# Patient Record
Sex: Female | Born: 1985 | Race: White | Hispanic: No | Marital: Single | Smoking: Former smoker
Health system: Southern US, Community
[De-identification: ages and names within clinical notes are randomized; demographics above are authoritative.]

## PROBLEM LIST (undated history)

## (undated) DIAGNOSIS — J45909 Unspecified asthma, uncomplicated: Secondary | ICD-10-CM

## (undated) DIAGNOSIS — F909 Attention-deficit hyperactivity disorder, unspecified type: Secondary | ICD-10-CM

## (undated) DIAGNOSIS — F988 Other specified behavioral and emotional disorders with onset usually occurring in childhood and adolescence: Secondary | ICD-10-CM

## (undated) DIAGNOSIS — N83209 Unspecified ovarian cyst, unspecified side: Secondary | ICD-10-CM

## (undated) HISTORY — PX: APPENDECTOMY: SHX54

## (undated) HISTORY — DX: Unspecified asthma, uncomplicated: J45.909

## (undated) HISTORY — DX: Other specified behavioral and emotional disorders with onset usually occurring in childhood and adolescence: F98.8

## (undated) HISTORY — DX: Attention-deficit hyperactivity disorder, unspecified type: F90.9

## (undated) HISTORY — PX: BREAST REDUCTION SURGERY: SHX8

---
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
## 2005-02-20 ENCOUNTER — Other Ambulatory Visit: Admission: RE | Admit: 2005-02-20 | Discharge: 2005-02-20 | Payer: Self-pay | Admitting: Internal Medicine

## 2007-05-30 ENCOUNTER — Other Ambulatory Visit: Admission: RE | Admit: 2007-05-30 | Discharge: 2007-05-30 | Payer: Self-pay | Admitting: Internal Medicine

## 2008-04-22 ENCOUNTER — Observation Stay (HOSPITAL_COMMUNITY): Admission: EM | Admit: 2008-04-22 | Discharge: 2008-04-24 | Payer: Self-pay | Admitting: Emergency Medicine

## 2008-04-22 ENCOUNTER — Encounter (INDEPENDENT_AMBULATORY_CARE_PROVIDER_SITE_OTHER): Payer: Self-pay | Admitting: Surgery

## 2008-12-17 ENCOUNTER — Emergency Department (HOSPITAL_COMMUNITY): Admission: EM | Admit: 2008-12-17 | Discharge: 2008-12-17 | Payer: Self-pay | Admitting: Emergency Medicine

## 2010-09-26 NOTE — Op Note (Signed)
NAME:  Pamela Reilly, Pamela Reilly                 ACCOUNT NO.:  [id number]   MEDICAL RECORD NO.:  [id number]          PATIENT TYPE:  INP   LOCATION:  5157                         FACILITY:  [facility]   PHYSICIAN:  Ardeth Sportsman, MD     DATE OF BIRTH:  11-06-1985   DATE OF PROCEDURE:  DATE OF DISCHARGE:                               OPERATIVE REPORT   PRIMARY CARE PHYSICIAN:  Lovenia Kim, DO   EMERGENCY ROOM PHYSICIAN:  Lear Ng, MD at [facility] Emergency  Department.   SURGEON:  Karie Soda, MD   ASSISTANT:  None.   PREOPERATIVE DIAGNOSIS:  Abdominal pain and possible early appendicitis.   POSTOPERATIVE DIAGNOSES:  1. Abdominal pain and possible early appendicitis.  2. Small right ovarian cyst, probably physiologic.   PROCEDURE PERFORMED:  1. Diagnostic laparoscopy.  2. Laparoscopic appendectomy.  3. Laparoscopic aspiration of right ovarian cyst.   ANESTHESIA:  1. General anesthesia.  2. Local anesthetic and a field block on all port sites.   SPECIMEN:  Appendix.   DRAINS:  None.   ESTIMATED BLOOD LOSS:  Less than 5 mL.   COMPLICATIONS:  None apparent.   INDICATIONS:  Pamela Reilly is a 25 year old female with a 48-hour history of  abdominal pain that was periumbilical that became more intense yesterday  and focal on the right lower quadrant with some peritoneal signs.  She  had a normal white count and her CT scan cannot definitely see an  appendix, but there was no definite other etiology except for a small  physiologic right cyst that does not seem consistent and are correlating  with a history being on oral contraceptive pills.  Given the very  intense nature, accelerating worsening pain with her physical exam,  today recommendation was made for diagnostic laparoscopy and at the very  least close monitoring.   Techniques of diagnostic laparoscopy with appendectomy was discussed.  Differential diagnosis was discussed.  Risks, benefits, and alternatives  were discussed.   The patient and family wished to go ahead and proceed  with surgery tonight.   OPERATIVE FINDINGS:  She did not have any strong evidence of any  peritonitis or suppuration.  She had a small retrocecal appendix with  the distal half somewhat obliterated and fibrous and slightly thickened,  concerning for possible early appendicitis.  The small bowel was  completely normal, as well as the colon.  She had a small right ovarian  cyst that looked like a simple physiologic cyst and we aspirated with  clear serous fluid.  Left ovary appeared uninvolved and the uterus  appeared normal as well.  There was no other abnormalities in the  abdominal cavity and there was no evidence of any umbilical or  incisional hernias.   DESCRIPTION OF PROCEDURE:  Informed consent was confirmed.  The patient  received 2 g IV cefoxitin at the beginning of surgery, starting before  incision.  She had sequential compression devices active during the  entire case.  She underwent general anesthesia without any difficulty.  She was placed in supine with both arms tucked.  She had Foley catheter  sterilely placed.  Abdomen was prepped and draped in sterile fashion.   A 5-mm port was placed through the inferior part of the umbilicus using  a mini Hasson technique.  Capnoperitoneum to 15 mmHg provided a good  intraabdominal insufflation.  Camera inspection revealed no  intraabdominal injury.  Under direct visualization, the 5-mm port was  placed in the left lower quadrant and a 12-mm port was placed  suprapubically superior to the bladder.   Diagnostic laparoscopy was performed and revealed the findings as above  with no strong evidence of any severe peritonitis and a small appendix  but it did have some adhesions to the retroperitoneum and seemed to have  a little bit of distal fibrous distal thickening.  Inspection was made  and small bowel was run from the ileocecal valve all the way to ligament  of Treitz.  There  was no Meckel diverticulum or other apparent  abnormality.  There was no massive mesenteric lymphadenopathy to my  examination.  She had very thin mesentery and thin intraabdominal fat.  Colon was run from the cecum all the way down to the peritoneal  reflection and it was normal.  The ovaries appeared to be normal except  for the right ovary did have a cyst and was about 3 cm in size.  It  looked physiologic.  The liver, gallbladder, omentum, peritoneum, and  stomach appeared normal.   I did an appendectomy.  The terminal ileum and cecum were mobilized in  the lateral-to-medial fashion by elevating the cecum and taking off the  avascular side wall at attachments.  This helped mobilize the colon  well.  A window was made at the base of the appendix and the appendix  was transected using a laparoscopic stapler, taking a cuff of normal  cecum.  The appendix was elevated and the mesoappendix was ligated using  0 PDS Endoloop.  The cautery scissors were used to transect through the  mesoappendix with good result.  Appendix was removed out with 12 mm  port.   We went ahead and aspirated the right cyst using a laparoscopic needle  and aspirated about 8 mL of clear serous fluid.  I did not send it for  contents.  She did have few serous/mucoid deposits in the pelvis  consistent with physiological fluid.   Irrigation was done, over 2 liters of fluid with very clear return.  Inspection was made again in all 4 quadrants.  There was no abnormality  or injury.  Capnoperitoneum was evacuated and ports were removed.  The  suprapubic fascial site was closed using a 0-Vicryl stitch.  Skin was  closed using 4-0 Monocryl stitch.  Sterile dressing was applied.   The patient was extubated and was taken to recovery room in stable  condition.  She looks comfortable, relaxed, sleeping.  I am about to  explain the operative findings with the patient's family.  We will at  least observe her  overnight.      Ardeth Sportsman, MD  Electronically Signed     SCG/MEDQ  D:  04/22/2008  T:  04/23/2008  Job:  [id number]   cc:   Lovenia Kim, D.O.

## 2010-09-26 NOTE — H&P (Signed)
NAME:  Pamela Reilly, Pamela Reilly                 ACCOUNT NO.:  [id number]   MEDICAL RECORD NO.:  [id number]          PATIENT TYPE:  INP   LOCATION:  5157                         FACILITY:  [facility]   PHYSICIAN:  Ardeth Sportsman, MD     DATE OF BIRTH:  1985/08/15   DATE OF ADMISSION:  04/22/2008  DATE OF DISCHARGE:                              HISTORY & PHYSICAL   PRIMARY CARE PHYSICIAN:  Lovenia Kim, D.O.   REASON FOR CONSULT ON ADMISSION:  Abdominal pain, possible appendicitis.   HISTORY OF PRESENT ILLNESS:  Ms. Hallmon is a 25 year old female otherwise  very healthy who has got a 48 hour history of abdominal pain.  She  describes it as being periumbilical.  She had some decreased appetite  with this and some nausea as well.  The pain intensified last night.  The pain started being more focal in the right lower quadrant.  She  actually vomited some liquid back up.  No hematemesis.  She denies any  sick contacts or travel history.  She never had anything before.   She is on oral contraceptive pills and her last menstrual period was  last week.  She does get some cramping that is moderate in intensity,  but lasts for a few days and this abdominal pain is nothing like that.  She denies any vaginal bleeding or discharge.  No history of STDs or  other abnormalities.   She has a brother with colon polyps and bleeding and has had endoscopies  in the past.  She had 1 episode of blood in the toilet after having a  bowel movement about a month ago, but no diarrhea associated with any  other abnormalities.  She never had anything before and she had no  abdominal pain at that time.  No history of inflammatory bowel disease  or irritable bowel syndrome.  She only has a bowel movement about 1-2  times a day.   PAST MEDICAL HISTORY:  Recurrent tonsillitis.   PAST SURGICAL HISTORY:  Negative.   MEDICATIONS:  She is on Yaz 28 oral contraceptive pills.   ALLERGIES:  None.   SOCIAL HISTORY:  No tobacco,  alcohol, or drug use.  She is here with her  family including her parents.   FAMILY HISTORY:  A brother with colon polyp, no other history of any  digestive tract etiologies or any cardiopulmonary disease.   REVIEW OF SYSTEMS:  As noted in HPI.  GENERAL:  No gain or weight loss.  She had a temperature of 99.4.  No definite chills or sweats.  Eyes,  ENT, cardiac, respiratory are negative.  Neurologic, psychiatric heme,  lymph, allergic otherwise negative.  Hepatic, renal, and endocrine  negative.  Dermatologic:  Negative.  GI:  As noted above, otherwise  negative.   PHYSICAL EXAMINATION:  VITAL SIGNS:  Temperature 97.3, pulse 107 initial  to my exam with 95, respirations 16, blood pressure 155/94.  She had  10/10 pain after getting 2 doses of IV narcotics in the past hour.  It  has gone down to around 7/10 pain.  She has 100% sats room air.  GENERAL:  She is a well-developed, well-nourished, thin female lying in  bed, anxious, but consolable.  PSYCH:  She had no evidence of any dementia, delirium, psychosis,  paranoia.  She is little anxious, tearful, but consolable.  EYES:  Pupils equal, round and reactive to light.  Extraocular movements  are intact.  Sclerae slightly injected, crying, but no evidence of any  icterus or conjunctivitis.  NECK:  Supple, no masses.  Trachea is midline.  HEENT:  She is normocephalic.  Mucous membranes are moist.  Nasopharynx  and oropharynx are clear.  She has some mildly inflamed tonsils, but no  frank pus or purulence.  HEART:  Regular rate and rhythm.  No murmurs, gallops, or rubs.  CHEST:  Clear to auscultation bilaterally.  No wheezes or rhonchi.  No  pain on external compression.  BREAST:  No obvious masses or discharge.  ABDOMEN:  Soft and flat.  She is nontender on her left side in the right  upper quadrant but in the right lower quadrant she does have some  abdominal pain.  I would not say she jumps off McBurney point, but she  is obviously  uncomfortable.  She had some urinary urgency when I pressed  in the suprapubic region.  She does have a positive psoas sign, but not  certainly an obturator sign.  She does have pain to cough and bed shake,  and she has Rovsing sign with discomfort when I press the left lower  quadrant.  GENITALIA:  External female genitalia with inguinal hernias.  LYMPH:  No headache, axillary or groin lymphadenopathy.  RECTAL:  Refused on the patient's request.  EXTREMITIES:  No clubbing, cyanosis or edema.  MUSCULOSKELETAL:  Full range of motion in shoulders, elbows, wrist as  well as hip, knees, and ankles.  SKIN:  No petechiae, no purpura, no other source of lesion.  BACK:  No pain in cervical, thoracic, lumbosacral spine.   LABORATORY VALUES:  Her white count is normal at 7.9 with no left shift.  Urinalysis negative.  Urine pregnancy is negative electrolytes are okay.  She had wet prep that showed a few clue cells but otherwise completely  negative prep.  Please note above that Dr. Alonna Minium did a complete pelvic  exam and it was overwhelming to him.   STUDIES:  CT scan was done in the outside where the appendix cannot be  seen and her right ovarian cyst.  I looked to the films with radiology  as well.  I cannot definitely see the appendix.  There is no definite  stranding in the right lower quadrant with just a little bit of free  fluid that sits between her cecum and her bladder but it seems more  physiologic.  She has an obvious cyst in the right ovary around 2 cm at  best and seems to be physiologic and not particularly inflamed.  Left  ovary seems normal.  Uterus is slightly to the right.  There is no  evidence of bowel obstruction.  There is no terminal ileal thickening.  There is no evidence of any diverticulosis or free air.  She has no  obvious umbilical or inguinal hernias.  On CT scan, she does have some  mildly enlarged lymph nodes in her small bowel mesentery concerning for  mesenteric  adenitis.   ASSESSMENT AND PLAN:  A 25 year old female with classic story for acute  appendicitis without any definite laboratory and some physical  examination  findings concerning for some mild peritonitis and right  lower quadrant pain but with no elevated white count and CT that is not  diagnostic.   Differential diagnosis is discussed.   Abdominal pain of uncertain etiology.   Deferential diagnosis is discussed.  I think it mainly narrows down to  either mesenteric adenitis or an early appendicitis that we cannot  diagnose radiographically or she just has an uncomfortable right ovarian  cyst.  The cyst is not particularly enlarged seems physiologic and she  is already on oral contraceptive pills.  I am a little skeptical about  the definite etiology.  She has been on the oral contraceptive pills for  a while, and she notes that when she has been on that her menstrual  cycle pain is markedly gone down.   Because she is exquisitely tender and the pain is intensified and  accelerated I do not think it is safe for her to go home.  I offered to  watch her overnight and if she is not improved then to operate her  tomorrow, but my instinct feels that I should probably do diagnostic  laparoscopic now to make sure we are not missing anything that cannot be  seen on CAT scan.  I discussed with the patient and family.  They and  she would rather go and proceed with the surgery now than to see how she  is since she realizes the pain has been more intense.  She understands  it could be a negative diagnostic laparoscopy, but I think given her  discomfort it would be wise to check to be certain.   Anatomy and physiology of the digestive tract was explained.  Deferential diagnosis is discussed.  Technique of diagnostic laparoscopy  with appendectomy was discussed.  She and her family agreed to proceed.  We would do it pretty soon.      Ardeth Sportsman, MD  Electronically Signed      SCG/MEDQ  D:  04/22/2008  T:  04/23/2008  Job:  [id number]

## 2011-02-16 LAB — POCT I-STAT, CHEM 8
BUN: 5 mg/dL — ABNORMAL LOW (ref 6–23)
Calcium, Ion: 1.25 mmol/L (ref 1.12–1.32)
Chloride: 102 mEq/L (ref 96–112)
Sodium: 138 mEq/L (ref 135–145)

## 2011-02-16 LAB — CBC
HCT: 43.4 % (ref 36.0–46.0)
Hemoglobin: 14.8 g/dL (ref 12.0–15.0)
MCHC: 34.2 g/dL (ref 30.0–36.0)
MCV: 88.4 fL (ref 78.0–100.0)
Platelets: 228 K/uL (ref 150–400)
RBC: 4.91 MIL/uL (ref 3.87–5.11)
RDW: 12.4 % (ref 11.5–15.5)
WBC: 7.1 10*3/uL (ref 4.0–10.5)

## 2011-02-16 LAB — DIFFERENTIAL
Basophils Absolute: 0 10*3/uL (ref 0.0–0.1)
Basophils Relative: 0 % (ref 0–1)
Eosinophils Absolute: 0.1 K/uL (ref 0.0–0.7)
Eosinophils Relative: 2 % (ref 0–5)
Lymphocytes Relative: 27 % (ref 12–46)
Lymphs Abs: 1.9 K/uL (ref 0.7–4.0)
Monocytes Absolute: 0.5 10*3/uL (ref 0.1–1.0)
Monocytes Relative: 7 % (ref 3–12)
Neutro Abs: 4.5 10*3/uL (ref 1.7–7.7)
Neutrophils Relative %: 64 % (ref 43–77)

## 2011-02-16 LAB — URINALYSIS, ROUTINE W REFLEX MICROSCOPIC
Bilirubin Urine: NEGATIVE
Glucose, UA: NEGATIVE mg/dL
Hgb urine dipstick: NEGATIVE
Ketones, ur: NEGATIVE mg/dL
Nitrite: NEGATIVE
Protein, ur: NEGATIVE mg/dL
Specific Gravity, Urine: >1.046 — ABNORMAL HIGH (ref 1.005–1.030)
Urobilinogen, UA: 0.2 mg/dL (ref 0.0–1.0)
pH: 7.5 (ref 5.0–8.0)

## 2011-02-16 LAB — WET PREP, GENITAL
Trich, Wet Prep: NONE SEEN
WBC, Wet Prep HPF POC: NONE SEEN
Yeast Wet Prep HPF POC: NONE SEEN

## 2011-02-16 LAB — POCT PREGNANCY, URINE: Preg Test, Ur: NEGATIVE

## 2011-02-16 LAB — GC/CHLAMYDIA PROBE AMP, GENITAL
Chlamydia, DNA Probe: NEGATIVE
GC Probe Amp, Genital: NEGATIVE

## 2013-03-31 ENCOUNTER — Encounter: Payer: Self-pay | Admitting: Physician Assistant

## 2013-03-31 ENCOUNTER — Ambulatory Visit: Payer: Self-pay | Admitting: Emergency Medicine

## 2013-03-31 DIAGNOSIS — F988 Other specified behavioral and emotional disorders with onset usually occurring in childhood and adolescence: Secondary | ICD-10-CM | POA: Insufficient documentation

## 2013-04-01 ENCOUNTER — Ambulatory Visit: Payer: Self-pay | Admitting: Physician Assistant

## 2013-04-01 ENCOUNTER — Encounter: Payer: Self-pay | Admitting: Physician Assistant

## 2013-04-01 VITALS — BP 110/72 | HR 60 | Temp 98.1°F | Resp 16 | Ht 66.25 in | Wt 146.0 lb

## 2013-04-01 DIAGNOSIS — F909 Attention-deficit hyperactivity disorder, unspecified type: Secondary | ICD-10-CM | POA: Insufficient documentation

## 2013-04-01 DIAGNOSIS — N63 Unspecified lump in unspecified breast: Secondary | ICD-10-CM

## 2013-04-01 MED ORDER — AMPHETAMINE-DEXTROAMPHETAMINE 20 MG PO TABS: 20.0000 mg | ORAL_TABLET | Freq: Two times a day (BID) | ORAL | Status: DC

## 2013-04-01 NOTE — Patient Instructions (Signed)
Breast Cyst  A breast cyst is a sac in the breast that is filled with fluid. Breast cysts are common in women. Women can have one or many cysts. When the breasts contain many cysts, it is usually due to a noncancerous (benign) condition called fibrocystic change. These lumps form under the influence of female hormones (estrogen and progesterone). The lumps are most often located in the upper, outer portion of the breast. They are often more swollen, painful, and tender before your period starts. They usually disappear after menopause, unless you are on hormone therapy.   There are several types of cysts:  · Macrocyst. This is a cyst that is about 2 in. (5.1 cm) in diameter.    · Microcyst. This is a tiny cyst that you cannot feel but can be seen with a mammogram or an ultrasound.    · Galactocele. This is a cyst containing milk that may develop if you suddenly stop breastfeeding.    · Sebaceous cyst of the skin. This type of cyst is not in the breast tissue itself.  Breast cysts do not increase your risk of breast cancer. However, they must be monitored closely because they can be cancerous.   CAUSES   It is not known exactly what causes a breast cyst to form. Possible causes include:   · An overgrowth of milk glands and connective tissue in the breast can block the milk glands, causing them to fill with fluid.    · Scar tissue in the breast from previous surgery may block the glands, causing a cyst.    RISK FACTORS  Estrogen may influence the development of a breast cyst.    SIGNS AND SYMPTOMS   · Feeling a smooth, round, soft lump (like a grape) in the breast that is easily moveable.    · Breast discomfort or pain.  · Increase in size of the lump before your menstrual period and decrease in its size after your menstrual period.    DIAGNOSIS   A cyst can be felt during a physical exam by your health care provider. A breast X-ray exam (mammogram) and ultrasonography will be done to confirm the diagnosis. Fluid may  be removed from the cyst with a needle (fine needle aspiration) to make sure the cyst is not cancerous.    TREATMENT   Treatment may not be necessary. Your health care provider may monitor the cyst to see if it goes away on its own. If treatment is needed, it may include:  · Hormone treatment.    · Needle aspiration. There is a chance of the cyst coming back after aspiration.    · Surgery to remove the whole cyst.    HOME CARE INSTRUCTIONS   · Keep all follow-up appointments with your health care provider.  · See your health care provider regularly:  · Get a yearly exam by your health care provider.  · Have a clinical breast exam by a health care provider every 1 3 years if you are 20 27 years of age. After age 40 years, you should have the exam every year.    · Get mammogram tests as directed by your health care provider.    · Understand the normal appearance and feel of your breasts and perform breast self-exams.    · Only take over-the-counter or prescription medicines as directed by your health care provider.    · Wear a supportive bra, especially when exercising.    · Avoid caffeine.    · Reduce your salt intake, especially before your menstrual period. Too much salt can cause fluid retention, breast   swelling, and discomfort.    SEEK MEDICAL CARE IF:   · You feel, or think you feel, a lump in your breast.    · You notice that both breasts look or feel different than usual.    · Your breast is still causing pain after your menstrual period is over.    · You need medicine for breast pain and swelling that occurs with your menstrual period.    SEEK IMMEDIATE MEDICAL CARE IF:   · You have severe pain, tenderness, redness, or warmth in your breast.    · You have nipple discharge or bleeding.    · Your breast lump becomes hard and painful.    · You find new lumps or bumps that were not there before.    · You feel lumps in your armpit (axilla).    · You notice dimpling or wrinkling of the breast or nipple.    · You  have a fever.    MAKE SURE YOU:  · Understand these instructions.  · Will watch your condition.  · Will get help right away if you are not doing well or get worse.  Document Released: 04/30/2005 Document Revised: 12/31/2012 Document Reviewed: 11/27/2012  [employer]® Patient Information ©2014 [employer].

## 2013-04-01 NOTE — Progress Notes (Signed)
  Subjective:    Patient ID: Pamela Reilly, female    DOB: May 17, 1985, 27 y.o.   MRN: [id number]  HPI History of breast reduction 4 years ago and last week felt tender, mass on right breast over the inscion. Does have family history of breast cancer in mgrandmother and two m aunts. Has had night sweats but no weight loss. Patient also states she has had a rash/skin change on her right breast.   Past Medical History  Diagnosis Date  . Asthma   . ADD (attention deficit disorder)   . ADHD (attention deficit hyperactivity disorder)    Current Outpatient Prescriptions on File Prior to Visit  Medication Sig Dispense Refill  . amphetamine-dextroamphetamine (ADDERALL) 20 MG tablet Take 20 mg by mouth 2 (two) times daily.       No current facility-administered medications on file prior to visit.    Review of Systems  Constitutional: Positive for fatigue (night sweats). Negative for fever, chills, diaphoresis and appetite change.  HENT: Negative.   Eyes: Negative.   Respiratory: Negative.   Cardiovascular: Negative.   Gastrointestinal: Negative.   Genitourinary: Negative.   Musculoskeletal: Negative.   Skin: Positive for rash.  Neurological: Negative.        Objective:   Physical Exam  Constitutional: She is oriented to person, place, and time. She appears well-developed and well-nourished.  HENT:  Head: Normocephalic and atraumatic.  Eyes: Conjunctivae are normal. Pupils are equal, round, and reactive to light.  Neck: Normal range of motion. Neck supple.  Cardiovascular: Normal rate and regular rhythm.   Pulmonary/Chest: Effort normal and breath sounds normal. Right breast exhibits mass, skin change and tenderness. Right breast exhibits no inverted nipple and no nipple discharge.    Abdominal: Soft. Bowel sounds are normal.  Musculoskeletal: Normal range of motion.  Neurological: She is alert and oriented to person, place, and time.  Skin: Skin is warm and dry. Rash (inferior to  right nipple dry, orange rough skin) noted.      Assessment & Plan:  1. Breast mass Possible FBD, patient given instructions for cyst however with her family history and skin changes we will get a diagnostic [facility] - MM Digital Diagnostic Unilat R; Future  2. ADD Adderall 20mg  #60 NR

## 2013-04-02 ENCOUNTER — Other Ambulatory Visit: Payer: Self-pay | Admitting: Physician Assistant

## 2013-04-02 DIAGNOSIS — N63 Unspecified lump in unspecified breast: Secondary | ICD-10-CM

## 2013-04-02 DIAGNOSIS — R234 Changes in skin texture: Secondary | ICD-10-CM

## 2013-04-07 ENCOUNTER — Ambulatory Visit
Admission: RE | Admit: 2013-04-07 | Discharge: 2013-04-07 | Disposition: A | Discharge status: Home / Self Care | Payer: No Typology Code available for payment source | Source: Ambulatory Visit | Attending: Physician Assistant | Admitting: Physician Assistant

## 2013-04-07 DIAGNOSIS — N63 Unspecified lump in unspecified breast: Secondary | ICD-10-CM

## 2013-04-07 DIAGNOSIS — R234 Changes in skin texture: Secondary | ICD-10-CM

## 2013-04-28 ENCOUNTER — Other Ambulatory Visit: Payer: Self-pay | Admitting: Physician Assistant

## 2013-04-28 MED ORDER — AMPHETAMINE-DEXTROAMPHETAMINE 20 MG PO TABS: 20.0000 mg | ORAL_TABLET | Freq: Two times a day (BID) | ORAL | Status: DC

## 2013-05-14 HISTORY — PX: TONSILLECTOMY: SUR1361

## 2013-06-01 ENCOUNTER — Other Ambulatory Visit: Payer: Self-pay | Admitting: Physician Assistant

## 2013-06-01 MED ORDER — AMPHETAMINE-DEXTROAMPHETAMINE 20 MG PO TABS: 20.0000 mg | ORAL_TABLET | Freq: Two times a day (BID) | ORAL | Status: DC

## 2013-06-29 ENCOUNTER — Other Ambulatory Visit: Payer: Self-pay | Admitting: Emergency Medicine

## 2013-06-29 MED ORDER — AMPHETAMINE-DEXTROAMPHETAMINE 20 MG PO TABS: 20.0000 mg | ORAL_TABLET | Freq: Two times a day (BID) | ORAL | Status: DC

## 2013-07-27 ENCOUNTER — Other Ambulatory Visit: Payer: Self-pay | Admitting: Emergency Medicine

## 2013-07-28 ENCOUNTER — Encounter: Payer: Self-pay | Admitting: Emergency Medicine

## 2013-07-28 ENCOUNTER — Ambulatory Visit (INDEPENDENT_AMBULATORY_CARE_PROVIDER_SITE_OTHER): Payer: No Typology Code available for payment source | Admitting: Emergency Medicine

## 2013-07-28 VITALS — BP 126/80 | HR 88 | Temp 98.6°F | Resp 18 | Ht 65.5 in | Wt 136.0 lb

## 2013-07-28 DIAGNOSIS — J329 Chronic sinusitis, unspecified: Secondary | ICD-10-CM

## 2013-07-28 DIAGNOSIS — J039 Acute tonsillitis, unspecified: Secondary | ICD-10-CM

## 2013-07-28 DIAGNOSIS — J40 Bronchitis, not specified as acute or chronic: Secondary | ICD-10-CM

## 2013-07-28 DIAGNOSIS — F988 Other specified behavioral and emotional disorders with onset usually occurring in childhood and adolescence: Secondary | ICD-10-CM

## 2013-07-28 DIAGNOSIS — J45909 Unspecified asthma, uncomplicated: Secondary | ICD-10-CM

## 2013-07-28 DIAGNOSIS — J309 Allergic rhinitis, unspecified: Secondary | ICD-10-CM

## 2013-07-28 MED ORDER — BENZONATATE 100 MG PO CAPS: 100.0000 mg | ORAL_CAPSULE | Freq: Three times a day (TID) | ORAL | Status: DC | PRN

## 2013-07-28 MED ORDER — AMPHETAMINE-DEXTROAMPHETAMINE 20 MG PO TABS: 20.0000 mg | ORAL_TABLET | Freq: Two times a day (BID) | ORAL | Status: DC

## 2013-07-28 MED ORDER — ALBUTEROL SULFATE HFA 108 (90 BASE) MCG/ACT IN AERS: 2.0000 | INHALATION_SPRAY | Freq: Four times a day (QID) | RESPIRATORY_TRACT | Status: DC | PRN

## 2013-07-28 MED ORDER — AZITHROMYCIN 250 MG PO TABS: ORAL_TABLET | ORAL | Status: AC

## 2013-07-28 MED ORDER — PREDNISONE 10 MG PO TABS: ORAL_TABLET | ORAL | Status: DC

## 2013-07-28 NOTE — Progress Notes (Signed)
Subjective:    Patient ID: Pamela Reilly, female    DOB: 06/16/1985, 28 y.o.   MRN: [id number]  HPI Comments: 28 yo female for ADD recheck she has been using BID. She notes helps with focus she only uses at work and does not take on the weekends unless needed. She denies any adverse SE  She notes chronic tonsils swelling. She notes snoring increased. She gets sick every other month. She has been sick x  4 days with allergy drainage that is now green from nose. Chest is just starting to be productive. She has been taking OTC Tylenol/ cold/ flu w/o relief. She notes she feels worse at night with mild fever.   Cough Associated symptoms include ear pain, a fever and a sore throat.     Medication List       This list is accurate as of: 07/28/13 11:59 PM.  Always use your most recent med list.               albuterol 108 (90 BASE) MCG/ACT inhaler  Commonly known as:  PROVENTIL HFA;VENTOLIN HFA  Inhale 2 puffs into the lungs every 6 (six) hours as needed for wheezing or shortness of breath.     amphetamine-dextroamphetamine 20 MG tablet  Commonly known as:  ADDERALL  Take 1 tablet (20 mg total) by mouth 2 (two) times daily.     azithromycin 250 MG tablet  Commonly known as:  ZITHROMAX  Take 2 tablets (500 mg) on  Day 1,  followed by 1 tablet (250 mg) once daily on Days 2 through 5.     benzonatate 100 MG capsule  Commonly known as:  TESSALON PERLES  Take 1 capsule (100 mg total) by mouth 3 (three) times daily as needed for cough.     predniSONE 10 MG tablet  Commonly known as:  DELTASONE  1 po TID x 3 days, 1 PO BID x 3 days, 1 po QD x 5 days       No Known Allergies  Past Medical History  Diagnosis Date  . Asthma   . ADD (attention deficit disorder)   . ADHD (attention deficit hyperactivity disorder)      Review of Systems  Constitutional: Positive for fever.  HENT: Positive for congestion, ear pain, sinus pressure and sore throat.   Respiratory: Positive for cough.    Psychiatric/Behavioral: Positive for decreased concentration.  All other systems reviewed and are negative.  BP 126/80  Pulse 88  Temp(Src) 98.6 F (37 C) (Temporal)  Resp 18  Ht 5' 5.5" (1.664 m)  Wt 136 lb (61.689 kg)  BMI 22.28 kg/m2  LMP 07/14/2013      Objective:   Physical Exam  Nursing note and vitals reviewed. Constitutional: She is oriented to person, place, and time. She appears well-developed and well-nourished.  HENT:  Head: Normocephalic and atraumatic.  Right Ear: External ear normal.  Left Ear: External ear normal.  Nose: Nose normal.  Mouth/Throat: Oropharynx is clear and moist. No oropharyngeal exudate.  Yellow TMs bilateral Maxillary tenderness   Eyes: Conjunctivae and EOM are normal.  Neck: Normal range of motion.  Cardiovascular: Normal rate, regular rhythm, normal heart sounds and intact distal pulses.   Pulmonary/Chest: Effort normal and breath sounds normal.  Musculoskeletal: Normal range of motion.  Lymphadenopathy:    She has no cervical adenopathy.  Neurological: She is alert and oriented to person, place, and time.  Skin: Skin is warm and dry.  Psychiatric: She has  a normal mood and affect. Judgment normal.          Assessment & Plan:  1. Sinusitis, Bronchitis, tonsillitis, Allergic rhinitis- Allegra OTC, increase H2o, allergy hygiene explained. ZPAK, Pred DP 10 mg Both AD. Tessalon perles 100 mg, Albuterol HFa both AD. Requests ENT referral  2. ADD- Refill RX AD

## 2013-07-28 NOTE — Patient Instructions (Signed)
Tonsillitis Tonsillitis is an infection of the throat that causes the tonsils to become red, tender, and swollen. Tonsils are collections of lymphoid tissue at the back of the throat. Each tonsil has crevices (crypts). Tonsils help fight nose and throat infections and keep infection from spreading to other parts of the body for the first 18 months of life.  CAUSES Sudden (acute) tonsillitis is usually caused by infection with streptococcal bacteria. Long-lasting (chronic) tonsillitis occurs when the crypts of the tonsils become filled with pieces of food and bacteria, which makes it easy for the tonsils to become repeatedly infected. SYMPTOMS  Symptoms of tonsillitis include:  A sore throat, with possible difficulty swallowing.  White patches on the tonsils.  Fever.  Tiredness.  New episodes of snoring during sleep, when you did not snore before.  Small, foul-smelling, yellowish-white pieces of material (tonsilloliths) that you occasionally cough up or spit out. The tonsilloliths can also cause you to have bad breath. DIAGNOSIS Tonsillitis can be diagnosed through a physical exam. Diagnosis can be confirmed with the results of lab tests, including a throat culture. TREATMENT  The goals of tonsillitis treatment include the reduction of the severity and duration of symptoms and prevention of associated conditions. Symptoms of tonsillitis can be improved with the use of steroids to reduce the swelling. Tonsillitis caused by bacteria can be treated with antibiotics. Usually, treatment with antibiotics is started before the cause of the tonsillitis is known. However, if it is determined that the cause is not bacterial, antibiotics will not treat the tonsillitis. If attacks of tonsillitis are severe and frequent, your caregiver may recommend surgery to remove the tonsils (tonsillectomy). HOME CARE INSTRUCTIONS   Rest as much as possible and get plenty of sleep.  Drink plenty of fluids. While the  throat is very sore, eat soft foods or liquids, such as sherbet, soups, or instant breakfast drinks.  Eat frozen ice pops.  Gargle with a warm or cold liquid to help soothe the throat. Mix 1/4 teaspoon of salt and 1/4 teaspoon of baking soda in in 8 oz of water. SEEK MEDICAL CARE IF:   Large, tender lumps develop in your neck.  A rash develops.  A green, yellow-brown, or bloody substance is coughed up.  You are unable to swallow liquids or food for 24 hours.  You notice that only one of the tonsils is swollen. SEEK IMMEDIATE MEDICAL CARE IF:   You develop any new symptoms such as vomiting, severe headache, stiff neck, chest pain, or trouble breathing or swallowing.  You have severe throat pain along with drooling or voice changes.  You have severe pain, unrelieved with recommended medications.  You are unable to fully open the mouth.  You develop redness, swelling, or severe pain anywhere in the neck.  You have a fever. MAKE SURE YOU:   Understand these instructions.  Will watch your condition.  Will get help right away if you are not doing well or get worse. Document Released: 02/07/2005 Document Revised: 12/31/2012 Document Reviewed: 10/17/2012 [facility] Patient Information 2014 [place], [place]. Allergic Rhinitis Allergic rhinitis is when the mucous membranes in the nose respond to allergens. Allergens are particles in the air that cause your body to have an allergic reaction. This causes you to release allergic antibodies. Through a chain of events, these eventually cause you to release histamine into the blood stream. Although meant to protect the body, it is this release of histamine that causes your discomfort, such as frequent sneezing, congestion, and an  itchy, runny nose.  CAUSES  Seasonal allergic rhinitis (hay fever) is caused by pollen allergens that may come from grasses, trees, and weeds. Year-round allergic rhinitis (perennial allergic rhinitis) is caused by  allergens such as house dust mites, pet dander, and mold spores.  SYMPTOMS   Nasal stuffiness (congestion).  Itchy, runny nose with sneezing and tearing of the eyes. DIAGNOSIS  Your health care provider can help you determine the allergen or allergens that trigger your symptoms. If you and your health care provider are unable to determine the allergen, skin or blood testing may be used. TREATMENT  Allergic Rhinitis does not have a cure, but it can be controlled by:  Medicines and allergy shots (immunotherapy).  Avoiding the allergen. Hay fever may often be treated with antihistamines in pill or nasal spray forms. Antihistamines block the effects of histamine. There are over-the-counter medicines that may help with nasal congestion and swelling around the eyes. Check with your health care provider before taking or giving this medicine.  If avoiding the allergen or the medicine prescribed do not work, there are many new medicines your health care provider can prescribe. Stronger medicine may be used if initial measures are ineffective. Desensitizing injections can be used if medicine and avoidance does not work. Desensitization is when a patient is given ongoing shots until the body becomes less sensitive to the allergen. Make sure you follow up with your health care provider if problems continue. HOME CARE INSTRUCTIONS It is not possible to completely avoid allergens, but you can reduce your symptoms by taking steps to limit your exposure to them. It helps to know exactly what you are allergic to so that you can avoid your specific triggers. SEEK MEDICAL CARE IF:   You have a fever.  You develop a cough that does not stop easily (persistent).  You have shortness of breath.  You start wheezing.  Symptoms interfere with normal daily activities. Document Released: 01/23/2001 Document Revised: 02/18/2013 Document Reviewed: 01/05/2013 [facility] Patient Information 2014 [place].

## 2013-07-29 ENCOUNTER — Encounter: Payer: Self-pay | Admitting: Emergency Medicine

## 2013-07-29 DIAGNOSIS — J45909 Unspecified asthma, uncomplicated: Secondary | ICD-10-CM

## 2013-07-29 HISTORY — DX: Unspecified asthma, uncomplicated: J45.909

## 2013-08-25 ENCOUNTER — Other Ambulatory Visit: Payer: Self-pay | Admitting: Physician Assistant

## 2013-08-25 MED ORDER — AMPHETAMINE-DEXTROAMPHETAMINE 20 MG PO TABS
20.0000 mg | ORAL_TABLET | Freq: Two times a day (BID) | ORAL | Status: DC
Start: 2013-08-25 — End: 2013-09-16

## 2013-09-16 ENCOUNTER — Other Ambulatory Visit: Payer: Self-pay | Admitting: Internal Medicine

## 2013-09-16 DIAGNOSIS — F988 Other specified behavioral and emotional disorders with onset usually occurring in childhood and adolescence: Secondary | ICD-10-CM

## 2013-09-16 MED ORDER — AMPHETAMINE-DEXTROAMPHETAMINE 20 MG PO TABS: 20.0000 mg | ORAL_TABLET | Freq: Two times a day (BID) | ORAL | Status: DC

## 2013-10-19 ENCOUNTER — Other Ambulatory Visit: Payer: Self-pay | Admitting: Emergency Medicine

## 2013-10-19 DIAGNOSIS — F988 Other specified behavioral and emotional disorders with onset usually occurring in childhood and adolescence: Secondary | ICD-10-CM

## 2013-10-19 MED ORDER — AMPHETAMINE-DEXTROAMPHETAMINE 20 MG PO TABS: 20.0000 mg | ORAL_TABLET | Freq: Two times a day (BID) | ORAL | Status: DC

## 2013-11-16 ENCOUNTER — Other Ambulatory Visit: Payer: Self-pay | Admitting: Physician Assistant

## 2013-11-16 DIAGNOSIS — F988 Other specified behavioral and emotional disorders with onset usually occurring in childhood and adolescence: Secondary | ICD-10-CM

## 2013-11-16 MED ORDER — AMPHETAMINE-DEXTROAMPHETAMINE 20 MG PO TABS: 20.0000 mg | ORAL_TABLET | Freq: Two times a day (BID) | ORAL | Status: DC

## 2013-12-16 ENCOUNTER — Other Ambulatory Visit: Payer: Self-pay | Admitting: Internal Medicine

## 2013-12-16 DIAGNOSIS — F988 Other specified behavioral and emotional disorders with onset usually occurring in childhood and adolescence: Secondary | ICD-10-CM

## 2013-12-16 MED ORDER — AMPHETAMINE-DEXTROAMPHETAMINE 20 MG PO TABS: 20.0000 mg | ORAL_TABLET | Freq: Two times a day (BID) | ORAL | Status: DC

## 2014-01-05 ENCOUNTER — Ambulatory Visit (INDEPENDENT_AMBULATORY_CARE_PROVIDER_SITE_OTHER): Payer: Self-pay | Admitting: Internal Medicine

## 2014-01-05 ENCOUNTER — Encounter: Payer: Self-pay | Admitting: Internal Medicine

## 2014-01-05 VITALS — BP 134/90 | HR 88 | Temp 98.8°F | Resp 16 | Ht 65.5 in | Wt 148.2 lb

## 2014-01-05 DIAGNOSIS — Z3009 Encounter for other general counseling and advice on contraception: Secondary | ICD-10-CM

## 2014-01-05 DIAGNOSIS — F988 Other specified behavioral and emotional disorders with onset usually occurring in childhood and adolescence: Secondary | ICD-10-CM

## 2014-01-05 DIAGNOSIS — Z30011 Encounter for initial prescription of contraceptive pills: Secondary | ICD-10-CM

## 2014-01-05 DIAGNOSIS — J209 Acute bronchitis, unspecified: Secondary | ICD-10-CM

## 2014-01-05 MED ORDER — CEPHALEXIN 500 MG PO CAPS: 500.0000 mg | ORAL_CAPSULE | Freq: Four times a day (QID) | ORAL | Status: AC

## 2014-01-05 MED ORDER — PREDNISONE 20 MG PO TABS
ORAL_TABLET | ORAL | Status: DC
Start: 2014-01-05 — End: 2014-09-20

## 2014-01-05 MED ORDER — NORGESTREL-ETHINYL ESTRADIOL 0.3-30 MG-MCG PO TABS: 1.0000 | ORAL_TABLET | Freq: Every day | ORAL | Status: DC

## 2014-01-05 MED ORDER — AMPHETAMINE-DEXTROAMPHETAMINE 20 MG PO TABS: ORAL_TABLET | ORAL | Status: DC

## 2014-01-05 NOTE — Progress Notes (Signed)
   Subjective:    Patient ID: Pamela Reilly, female    DOB: [birth year], 28 y.o.   MRN: [id number]  HPI Patient presents with a 3-4 week Hx/o cough - now productive of a greenish sputum. Also requests refill of Adderall which apparently she take very infrequently with improved ability to focus and stay on task. Lastly she desires a BCP and repots that she is not currently pregnant.    Medication List   albuterol MDI HFA inhaler  Inhale 2 puffs into the lungs every 6 (six) hours as needed     amphetamine-dextroamphetamine 20 MG tablet   ADDERALL  Take 1/2 to 1 tablet 2 to 3 x day if needed for ADD     cephALEXin 500 MG capsule  Take 1 capsule (500 mg total) by mouth 4 (four) times daily. For infection     norgestrel-ethinyl estradiol 0.3-30 MG-MCG tablet  Commonly known as:  LO/OVRAL  Take 1 tablet by mouth daily.     predniSONE 20 MG tablet  1 tab 3 x day for 3 days, then 1 tab 2 x day for 3 days, then 1 tab 1 x day for 5 days     No Known Allergies  Past Medical History  Diagnosis Date  . Asthma   . ADD (attention deficit disorder)   . ADHD (attention deficit hyperactivity disorder)   . Asthma due to seasonal allergies 07/29/2013   Review of Systems Neg except as above  Objective:   Physical Exam  BP 134/90  Pulse 88  Temp(Src) 98.8 F (37.1 C) (Temporal)  Resp 16  Ht 5' 5.5" (1.664 m)  Wt 148 lb 3.2 oz (67.223 kg)  BMI 24.28 kg/m2  HEENT - Eac's patent. TM's Nl. EOM's full. PERRLA. NasoOroPharynx clear. Neck - supple. Nl Thyroid. Carotids 2+ & No bruits, nodes, JVD Chest - Clear equal BS w/scattered Rales and rhonchi,but no wheezes. Cor - Nl HS. RRR w/o sig MGR. PP 1(+). No edema. Abd - No palpable organomegaly, masses or tenderness. BS nl. MS- FROM w/o deformities. Muscle power, tone and bulk Nl. Gait Nl. Neuro - No obvious Cr N abnormalities. Sensory, motor and Cerebellar functions appear Nl w/o focal abnormalities.  Assessment & Plan:   1. ADD (attention  deficit disorder) -Rx amphetamine-dextroamphetamine (ADDERALL) 20 MG tablet; Take 1/2 to 1 tablet 2 to 3 x day if needed for ADD  Dispense: 90 tablet; Refill: 0 Advised tolimit max 2/day  2. Acute bronchitis, unspecified organism -Rx Keflex 500 mg # 42 - qid -Rx Prednisone taper  3. Encounter for initial prescription of contraceptive pills -Rx LoOvral & advised also take bASA  for clot prophylaxis

## 2014-01-05 NOTE — Patient Instructions (Signed)

## 2014-02-16 ENCOUNTER — Other Ambulatory Visit: Payer: Self-pay | Admitting: Physician Assistant

## 2014-02-16 DIAGNOSIS — F988 Other specified behavioral and emotional disorders with onset usually occurring in childhood and adolescence: Secondary | ICD-10-CM

## 2014-02-16 MED ORDER — AMPHETAMINE-DEXTROAMPHETAMINE 20 MG PO TABS: ORAL_TABLET | ORAL | Status: DC

## 2014-03-19 ENCOUNTER — Other Ambulatory Visit: Payer: Self-pay | Admitting: Physician Assistant

## 2014-03-19 DIAGNOSIS — F988 Other specified behavioral and emotional disorders with onset usually occurring in childhood and adolescence: Secondary | ICD-10-CM

## 2014-03-19 MED ORDER — AMPHETAMINE-DEXTROAMPHETAMINE 20 MG PO TABS: ORAL_TABLET | ORAL | Status: DC

## 2014-05-04 ENCOUNTER — Other Ambulatory Visit: Payer: Self-pay | Admitting: Physician Assistant

## 2014-05-04 DIAGNOSIS — F988 Other specified behavioral and emotional disorders with onset usually occurring in childhood and adolescence: Secondary | ICD-10-CM

## 2014-05-04 MED ORDER — AMPHETAMINE-DEXTROAMPHETAMINE 20 MG PO TABS: ORAL_TABLET | ORAL | Status: DC

## 2014-06-08 ENCOUNTER — Other Ambulatory Visit: Payer: Self-pay | Admitting: Physician Assistant

## 2014-06-08 DIAGNOSIS — F988 Other specified behavioral and emotional disorders with onset usually occurring in childhood and adolescence: Secondary | ICD-10-CM

## 2014-06-08 MED ORDER — AMPHETAMINE-DEXTROAMPHETAMINE 20 MG PO TABS: ORAL_TABLET | ORAL | Status: DC

## 2014-07-06 ENCOUNTER — Other Ambulatory Visit: Payer: Self-pay | Admitting: Physician Assistant

## 2014-07-06 DIAGNOSIS — F988 Other specified behavioral and emotional disorders with onset usually occurring in childhood and adolescence: Secondary | ICD-10-CM

## 2014-07-06 MED ORDER — AMPHETAMINE-DEXTROAMPHETAMINE 20 MG PO TABS: ORAL_TABLET | ORAL | Status: DC

## 2014-07-09 ENCOUNTER — Ambulatory Visit: Payer: Self-pay | Admitting: Physician Assistant

## 2014-07-09 ENCOUNTER — Encounter: Payer: Self-pay | Admitting: Internal Medicine

## 2014-08-02 ENCOUNTER — Other Ambulatory Visit: Payer: Self-pay | Admitting: Physician Assistant

## 2014-08-02 DIAGNOSIS — F988 Other specified behavioral and emotional disorders with onset usually occurring in childhood and adolescence: Secondary | ICD-10-CM

## 2014-08-02 MED ORDER — AMPHETAMINE-DEXTROAMPHETAMINE 20 MG PO TABS: ORAL_TABLET | ORAL | Status: DC

## 2014-08-30 ENCOUNTER — Other Ambulatory Visit: Payer: Self-pay | Admitting: Physician Assistant

## 2014-08-30 DIAGNOSIS — F988 Other specified behavioral and emotional disorders with onset usually occurring in childhood and adolescence: Secondary | ICD-10-CM

## 2014-08-30 MED ORDER — AMPHETAMINE-DEXTROAMPHETAMINE 20 MG PO TABS: ORAL_TABLET | ORAL | Status: DC

## 2014-09-20 ENCOUNTER — Ambulatory Visit (INDEPENDENT_AMBULATORY_CARE_PROVIDER_SITE_OTHER): Payer: Self-pay | Admitting: Internal Medicine

## 2014-09-20 ENCOUNTER — Encounter: Payer: Self-pay | Admitting: Internal Medicine

## 2014-09-20 VITALS — BP 124/66 | HR 76 | Temp 98.2°F | Resp 18 | Ht 65.5 in | Wt 156.0 lb

## 2014-09-20 DIAGNOSIS — J45901 Unspecified asthma with (acute) exacerbation: Secondary | ICD-10-CM

## 2014-09-20 DIAGNOSIS — F909 Attention-deficit hyperactivity disorder, unspecified type: Secondary | ICD-10-CM

## 2014-09-20 DIAGNOSIS — F988 Other specified behavioral and emotional disorders with onset usually occurring in childhood and adolescence: Secondary | ICD-10-CM

## 2014-09-20 DIAGNOSIS — Z30011 Encounter for initial prescription of contraceptive pills: Secondary | ICD-10-CM

## 2014-09-20 MED ORDER — DROSPIRENONE-ETHINYL ESTRADIOL 3-0.02 MG PO TABS: 1.0000 | ORAL_TABLET | Freq: Every day | ORAL | Status: DC

## 2014-09-20 MED ORDER — AMPHETAMINE-DEXTROAMPHETAMINE 20 MG PO TABS: ORAL_TABLET | ORAL | Status: DC

## 2014-09-20 MED ORDER — MONTELUKAST SODIUM 10 MG PO TABS: 10.0000 mg | ORAL_TABLET | Freq: Every day | ORAL | Status: DC

## 2014-09-20 MED ORDER — ALBUTEROL SULFATE HFA 108 (90 BASE) MCG/ACT IN AERS: 2.0000 | INHALATION_SPRAY | Freq: Four times a day (QID) | RESPIRATORY_TRACT | Status: DC | PRN

## 2014-09-20 NOTE — Progress Notes (Signed)
   Subjective:    Patient ID: Pamela Reilly, female    DOB: Jun 23, 1985, 29 y.o.   MRN: [id number]  HPI  Patient reports to the office for refill of medications today.  She is currently on adderall.  She reports that she is taking it to help with concentration.  She reports that she takes it 1/2 tablet twice daily.  She does occasionally take it on the weekends when she is working.    She would like to try a new birth control.  She did not like it.  She found herself very emotional. She does report that she has tried nuvaring in the past and hated.  She reports that she does get very emotional on the medications.  Her periods are very regular now.  She has not had very much cramping.    Review of Systems  Constitutional: Negative for fever, chills and fatigue.  HENT: Negative for congestion, rhinorrhea, sore throat and trouble swallowing.   Respiratory: Positive for cough. Negative for chest tightness, shortness of breath and wheezing.   Cardiovascular: Negative for chest pain and palpitations.  Gastrointestinal: Negative for nausea, vomiting, abdominal pain, diarrhea and constipation.  Genitourinary: Negative for dysuria, urgency, frequency, hematuria, vaginal bleeding, vaginal discharge, difficulty urinating and vaginal pain.  Musculoskeletal: Negative for back pain and joint swelling.  Neurological: Negative for dizziness, weakness and numbness.       Objective:   Physical Exam  Constitutional: She is oriented to person, place, and time. She appears well-developed and well-nourished. No distress.  HENT:  Head: Normocephalic.  Mouth/Throat: Oropharynx is clear and moist. No oropharyngeal exudate.  Eyes: Conjunctivae are normal. No scleral icterus.  Neck: Normal range of motion. Neck supple. No JVD present. No thyromegaly present.  Cardiovascular: Normal rate, regular rhythm, normal heart sounds and intact distal pulses.  Exam reveals no gallop and no friction rub.   No murmur  heard. Pulmonary/Chest: Effort normal and breath sounds normal. No respiratory distress. She has no wheezes. She has no rales. She exhibits no tenderness.  Abdominal: Soft. Bowel sounds are normal. She exhibits no distension and no mass. There is no tenderness. There is no rebound and no guarding.  Musculoskeletal: Normal range of motion.  Lymphadenopathy:    She has no cervical adenopathy.  Neurological: She is alert and oriented to person, place, and time.  Skin: Skin is warm and dry. She is not diaphoretic.  Psychiatric: She has a normal mood and affect. Her behavior is normal. Judgment and thought content normal.  Nursing note and vitals reviewed.         Assessment & Plan:    1. ADD (attention deficit disorder) - amphetamine-dextroamphetamine (ADDERALL) 20 MG tablet; Take 1/2 to 1 tablet 2 x day if needed for ADD  Dispense: 60 tablet; Refill: 0  2. Encounter for initial prescription of contraceptive pills -asa with contraceptive - drospirenone-ethinyl estradiol (YAZ,GIANVI,LORYNA) 3-0.02 MG tablet; Take 1 tablet by mouth daily.  Dispense: 1 Package; Refill: 11  3. Asthma with acute exacerbation, unspecified asthma severity  - montelukast (SINGULAIR) 10 MG tablet; Take 1 tablet (10 mg total) by mouth daily.  Dispense: 30 tablet; Refill: 2 - albuterol (PROVENTIL HFA;VENTOLIN HFA) 108 (90 BASE) MCG/ACT inhaler; Inhale 2 puffs into the lungs every 6 (six) hours as needed for wheezing or shortness of breath.  Dispense: 1 Inhaler; Refill: 2   Pt. Due for CPE in 3 mo when insurance kicks in

## 2014-09-20 NOTE — Patient Instructions (Signed)
Drospirenone; Ethinyl Estradiol tablets What is this medicine? DROSPIRENONE; ETHINYL ESTRADIOL (dro SPY re nown; ETH in il es tra DYE ole) is an oral contraceptive (birth control pill). This medicine combines two types of female hormones, an estrogen and a progestin. It is used to prevent ovulation and pregnancy. This medicine may be used for other purposes; ask your health care provider or pharmacist if you have questions. COMMON BRAND NAME(S): Gianvi, Loryna, Nikki 28-Day, Ocella, Syeda, Vestura, Yasmin, Yaz, Zarah What should I tell my health care provider before I take this medicine? They need to know if you have or ever had any of these conditions: -abnormal vaginal bleeding -adrenal gland disease -blood vessel disease or blood clots -breast, cervical, endometrial, ovarian, liver, or uterine cancer -diabetes -gallbladder disease -heart disease or recent heart attack -high blood pressure -high cholesterol -high potassium level -kidney disease -liver disease -migraine headaches -stroke -systemic lupus erythematosus (SLE) -tobacco smoker -an unusual or allergic reaction to estrogens, progestins, or other medicines, foods, dyes, or preservatives -pregnant or trying to get pregnant -breast-feeding How should I use this medicine? Take this medicine by mouth. To reduce nausea, this medicine may be taken with food. Follow the directions on the prescription label. Take this medicine at the same time each day and in the order directed on the package. Do not take your medicine more often than directed. A patient package insert for the product will be given with each prescription and refill. Read this sheet carefully each time. The sheet may change frequently. Talk to your pediatrician regarding the use of this medicine in children. Special care may be needed. This medicine has been used in female children who have started having menstrual periods. Overdosage: If you think you have taken too  much of this medicine contact a poison control center or emergency room at once. NOTE: This medicine is only for you. Do not share this medicine with others. What if I miss a dose? If you miss a dose, refer to the patient information sheet you received with your medicine for direction. If you miss more than one pill, this medicine may not be as effective and you may need to use another form of birth control. What may interact with this medicine? -acetaminophen -antibiotics or medicines for infections, especially rifampin, rifabutin, rifapentine, and griseofulvin, and possibly penicillins or tetracyclines -aprepitant -ascorbic acid (vitamin C) -atorvastatin -barbiturate medicines, such as phenobarbital -bosentan -carbamazepine -caffeine -clofibrate -cyclosporine -dantrolene -doxercalciferol -felbamate -grapefruit juice -hydrocortisone -medicines for anxiety or sleeping problems, such as diazepam or temazepam -medicines for diabetes, including pioglitazone -mineral oil -modafinil -mycophenolate -nefazodone -oxcarbazepine -phenytoin -prednisolone -ritonavir or other medicines for HIV infection or AIDS -rosuvastatin -selegiline -soy isoflavones supplements -St. John's wort -tamoxifen or raloxifene -theophylline -thyroid hormones -topiramate -warfarin This product is different from other birth control pills because it contains the progestin drospirenone. Drospirenone may increase potassium levels. Interactions with other drugs may increase the chance of an elevated potassium level. You may need blood tests to check your potassium level. Drugs that can increase the potassium level include: -certain medications for high blood pressure or heart conditions (examples include ACE-inhibitors and also Angiotensin-II receptor blockers, and Eplerenone -dietary salt substitutes (these may contain potassium) -heparin -NSAIDs (antiinflammatory drugs), if they are taken long-term and daily,  like for arthritis -potassium supplements -some 'water pills' (diuretics like amiloride, spironolactone or triamterene) This list may not describe all possible interactions. Give your health care provider a list of all the medicines, herbs, non-prescription drugs, or dietary supplements   you use. Also tell them if you smoke, drink alcohol, or use illegal drugs. Some items may interact with your medicine. What should I watch for while using this medicine? Visit your doctor or health care professional for regular checks on your progress. You will need a regular breast and pelvic exam and Pap smear while on this medicine. Use an additional method of contraception during the first cycle that you take these tablets. If you have any reason to think you are pregnant, stop taking this medicine right away and contact your doctor or health care professional. If you are taking this medicine for hormone related problems, it may take several cycles of use to see improvement in your condition. Smoking increases the risk of getting a blood clot or having a stroke while you are taking birth control pills, especially if you are more than 29 years old. You are strongly advised not to smoke. This medicine can make your body retain fluid, making your fingers, hands, or ankles swell. Your blood pressure can go up. Contact your doctor or health care professional if you feel you are retaining fluid. This medicine can make you more sensitive to the sun. Keep out of the sun. If you cannot avoid being in the sun, wear protective clothing and use sunscreen. Do not use sun lamps or tanning beds/booths. If you wear contact lenses and notice visual changes, or if the lenses begin to feel uncomfortable, consult your eye care specialist. In some women, tenderness, swelling, or minor bleeding of the gums may occur. Notify your dentist if this happens. Brushing and flossing your teeth regularly may help limit this. See your dentist  regularly and inform your dentist of the medicines you are taking. If you are going to have elective surgery, you may need to stop taking this medicine before the surgery. Consult your health care professional for advice. This medicine does not protect you against HIV infection (AIDS) or any other sexually transmitted diseases. What side effects may I notice from receiving this medicine? Side effects that you should report to your doctor or health care professional as soon as possible: -allergic reactions like skin rash, itching or hives, swelling of the face, lips, or tongue -breast tissue changes or discharge -changes in vision -chest pain -confusion, trouble speaking or understanding -dark urine -general ill feeling or flu-like symptoms -light-colored stools -nausea, vomiting -pain, swelling, warmth in the leg -right upper belly pain -severe headaches -shortness of breath -sudden numbness or weakness of the face, arm or leg -trouble walking, dizziness, loss of balance or coordination -unusual vaginal bleeding -yellowing of the eyes or skin Side effects that usually do not require medical attention (report to your doctor or health care professional if they continue or are bothersome): -acne -brown spots on the face -change in appetite -change in sexual desire -depressed mood or mood swings -fluid retention and swelling -stomach cramps or bloating -unusually weak or tired -weight gain This list may not describe all possible side effects. Call your doctor for medical advice about side effects. You may report side effects to FDA at 1-800-FDA-1088. Where should I keep my medicine? Keep out of the reach of children. Store at room temperature between 15 and 30 degrees C (59 and 86 degrees F). Throw away any unused medicine after the expiration date. NOTE: This sheet is a summary. It may not cover all possible information. If you have questions about this medicine, talk to your doctor,  pharmacist, or health care provider.  2015, [employer]. (2008-04-15 13:02:54)  

## 2014-10-18 ENCOUNTER — Other Ambulatory Visit: Payer: Self-pay | Admitting: Physician Assistant

## 2014-10-18 ENCOUNTER — Telehealth: Payer: Self-pay | Admitting: *Deleted

## 2014-10-18 DIAGNOSIS — F988 Other specified behavioral and emotional disorders with onset usually occurring in childhood and adolescence: Secondary | ICD-10-CM

## 2014-10-18 MED ORDER — AMPHETAMINE-DEXTROAMPHETAMINE 20 MG PO TABS: ORAL_TABLET | ORAL | Status: DC

## 2014-10-18 NOTE — Telephone Encounter (Signed)
Patient informed that we can mail her RX for Adderall to her, but if it gets lost in the mail we can issue another one.  [place] [place] [address] -  [place]  [phone].

## 2014-11-22 ENCOUNTER — Telehealth: Payer: Self-pay

## 2014-11-22 NOTE — Telephone Encounter (Signed)
Received paper note from front office staff, patient called and wanted her Adderall prescription mailed to her at [place] new [place] [address] , [place], she also states that she is having cold and sinus sx, cough, sneezing and head congestion for approx 5 days and requesting a zpak as well to be [facility] [phone], per Quentin MullingAmanda Collier, PA, returned call to patient at [phone] denied both request. Per Quentin MullingAmanda Collier, PA states that patient has been advised over 30 days ago to find a new PCP as we would not continue to mail her Adderall, advised her to seek attention at Urgent Care for cold and sinus if needed.

## 2014-12-27 ENCOUNTER — Encounter: Payer: Self-pay | Admitting: Internal Medicine

## 2014-12-27 ENCOUNTER — Other Ambulatory Visit: Payer: Self-pay | Admitting: Physician Assistant

## 2014-12-27 DIAGNOSIS — F988 Other specified behavioral and emotional disorders with onset usually occurring in childhood and adolescence: Secondary | ICD-10-CM

## 2014-12-27 MED ORDER — AMPHETAMINE-DEXTROAMPHETAMINE 20 MG PO TABS: ORAL_TABLET | ORAL | Status: DC

## 2015-01-03 ENCOUNTER — Ambulatory Visit (INDEPENDENT_AMBULATORY_CARE_PROVIDER_SITE_OTHER): Payer: Self-pay | Admitting: Physician Assistant

## 2015-01-03 ENCOUNTER — Encounter: Payer: Self-pay | Admitting: Physician Assistant

## 2015-01-03 VITALS — BP 118/78 | HR 84 | Temp 97.7°F | Resp 16 | Ht 65.5 in | Wt 152.8 lb

## 2015-01-03 DIAGNOSIS — Z124 Encounter for screening for malignant neoplasm of cervix: Secondary | ICD-10-CM

## 2015-01-03 DIAGNOSIS — E559 Vitamin D deficiency, unspecified: Secondary | ICD-10-CM

## 2015-01-03 DIAGNOSIS — J45909 Unspecified asthma, uncomplicated: Secondary | ICD-10-CM

## 2015-01-03 DIAGNOSIS — F909 Attention-deficit hyperactivity disorder, unspecified type: Secondary | ICD-10-CM

## 2015-01-03 DIAGNOSIS — F988 Other specified behavioral and emotional disorders with onset usually occurring in childhood and adolescence: Secondary | ICD-10-CM

## 2015-01-03 DIAGNOSIS — Z23 Encounter for immunization: Secondary | ICD-10-CM

## 2015-01-03 DIAGNOSIS — Z79899 Other long term (current) drug therapy: Secondary | ICD-10-CM | POA: Insufficient documentation

## 2015-01-03 MED ORDER — AMPHETAMINE-DEXTROAMPHETAMINE 20 MG PO TABS: ORAL_TABLET | ORAL | Status: DC

## 2015-01-03 NOTE — Progress Notes (Signed)
Complete Physical  Assessment and Plan: Health Maintenance- Discussed STD testing, safe sex, alcohol and drug awareness, drinking and driving dangers, wearing a seat belt and general safety measures for young adult. Cervical cancer screen- will get PAP, will schedule sometime next week ADD- prescription given Asthma- get on allergy pill, mucinex, and use albuterol, will check next week tetanus  Discussed med's effects and SE's. Screening labs and tests as requested with regular follow-up as recommended. Over 40 minutes of exam, counseling, chart review and critical decision making was performed  HPI  This very nice 29 y.o.female presents for complete physical.  Patient has no major health issues.  Patient reports no complaints at this time.  She does workout.  Finally, patient has history of Vitamin D Deficiency and last vitamin D.  Currently on supplementation.  She is on adderall for ADD.  [place] but is back.  She has allergies and asthma, states she had a zpak a month and half ago and continues to have cough, day and night, some mucus. No fever/chills.   Current Medications:  Current Outpatient Prescriptions on File Prior to Visit  Medication Sig Dispense Refill  . albuterol (PROVENTIL HFA;VENTOLIN HFA) 108 (90 BASE) MCG/ACT inhaler Inhale 2 puffs into the lungs every 6 (six) hours as needed for wheezing or shortness of breath. 1 Inhaler 2  . amphetamine-dextroamphetamine (ADDERALL) 20 MG tablet Take 1/2 to 1 tablet 2 x day if needed for ADD 60 tablet 0  . drospirenone-ethinyl estradiol (YAZ,GIANVI,LORYNA) 3-0.02 MG tablet Take 1 tablet by mouth daily. 1 Package 11  . montelukast (SINGULAIR) 10 MG tablet Take 1 tablet (10 mg total) by mouth daily. 30 tablet 2   No current facility-administered medications on file prior to visit.   Health Maintenance:   Immunization History  Administered Date(s) Administered  . Td 03/31/2004    TD/TDAP: 2005  DUE Influenza: N/A Pneumovax: N/A Prevnar 13: N/A HPV vaccine: N/A  LMP: 2 weeks ago Sexually Active: yes STD testing offered, declines Pap: unknown MGM:  N/A Last Dental Exam: N/A Last Eye Exam: N/A  Allergies: No Known Allergies Medical History:  Past Medical History  Diagnosis Date  . Asthma   . ADD (attention deficit disorder)   . ADHD (attention deficit hyperactivity disorder)   . Asthma due to seasonal allergies 07/29/2013   Surgical History: No past surgical history on file. Family History:  Family History  Problem Relation Age of Onset  . Diabetes Mother    Social History:  Social History  Substance Use Topics  . Smoking status: Never Smoker   . Smokeless tobacco: Never Used  . Alcohol Use: No   Review of Systems: Review of Systems  Constitutional: Negative.  Negative for fever and chills.  HENT: Negative.   Eyes: Negative.   Respiratory: Positive for cough. Negative for hemoptysis, sputum production, shortness of breath and wheezing.   Cardiovascular: Negative.   Gastrointestinal: Negative.   Genitourinary: Negative.   Musculoskeletal: Negative.   Skin: Negative.   Neurological: Negative.   Psychiatric/Behavioral: Negative.     Physical Exam: Estimated body mass index is 25.03 kg/(m^2) as calculated from the following:   Height as of this encounter: 5' 5.5" (1.664 m).   Weight as of this encounter: 152 lb 12.8 oz (69.31 kg). BP 118/78 mmHg  Pulse 84  Temp(Src) 97.7 F (36.5 C)  Resp 16  Ht 5' 5.5" (1.664 m)  Wt 152 lb 12.8 oz (69.31 kg)  BMI  25.03 kg/m2 General Appearance: Well nourished, in no apparent distress.  Eyes: PERRLA, EOMs, conjunctiva no swelling or erythema, normal fundi and vessels.  Sinuses: No Frontal/maxillary tenderness  ENT/Mouth: Ext aud canals clear, normal light reflex with TMs without erythema, bulging. Good dentition. No erythema, swelling, or exudate on post pharynx. Tonsils not swollen or erythematous. Hearing normal.   Neck: Supple, thyroid normal. No bruits  Respiratory: Respiratory effort normal, BS equal bilaterally without rales, rhonchi, wheezing or stridor.  Cardio: RRR without murmurs, rubs or gallops. Brisk peripheral pulses without edema.  Chest: symmetric, with normal excursions and percussion.  Breasts: will get next week.  Abdomen: Soft, nontender, no guarding, rebound, hernias, masses, or organomegaly.  Lymphatics: Non tender without lymphadenopathy.  Genitourinary: will get next week Musculoskeletal: Full ROM all peripheral extremities,5/5 strength, and normal gait.  Skin: Warm, dry without rashes, lesions, ecchymosis. Neuro: Cranial nerves intact, reflexes equal bilaterally. Normal muscle tone, no cerebellar symptoms. Sensation intact.  Psych: Awake and oriented X 3, normal affect, Insight and Judgment appropriate.   EKG: defer  Quentin Mulling 2:05 PM [facility]

## 2015-01-03 NOTE — Patient Instructions (Signed)
Preventive Care for Adults A healthy lifestyle and preventive care can promote health and wellness. Preventive health guidelines for women include the following key practices.  A routine yearly physical is a good way to check with your health care provider about your health and preventive screening. It is a chance to share any concerns and updates on your health and to receive a thorough exam.  Visit your dentist for a routine exam and preventive care every 6 months. Brush your teeth twice a day and floss once a day. Good oral hygiene prevents tooth decay and gum disease.  The frequency of eye exams is based on your age, health, family medical history, use of contact lenses, and other factors. Follow your health care provider's recommendations for frequency of eye exams.  Eat a healthy diet. Foods like vegetables, fruits, whole grains, low-fat dairy products, and lean protein foods contain the nutrients you need without too many calories. Decrease your intake of foods high in solid fats, added sugars, and salt. Eat the right amount of calories for you.Get information about a proper diet from your health care provider, if necessary.  Regular physical exercise is one of the most important things you can do for your health. Most adults should get at least 150 minutes of moderate-intensity exercise (any activity that increases your heart rate and causes you to sweat) each week. In addition, most adults need muscle-strengthening exercises on 2 or more days a week.  Maintain a healthy weight. The body mass index (BMI) is a screening tool to identify possible weight problems. It provides an estimate of body fat based on height and weight. Your health care provider can find your BMI and can help you achieve or maintain a healthy weight.For adults 20 years and older:  A BMI below 18.5 is considered underweight.  A BMI of 18.5 to 24.9 is normal.  A BMI of 25 to 29.9 is considered overweight.  A BMI of  30 and above is considered obese.  Maintain normal blood lipids and cholesterol levels by exercising and minimizing your intake of saturated fat. Eat a balanced diet with plenty of fruit and vegetables. Blood tests for lipids and cholesterol should begin at age 76 and be repeated every 5 years. If your lipid or cholesterol levels are high, you are over 50, or you are at high risk for heart disease, you may need your cholesterol levels checked more frequently.Ongoing high lipid and cholesterol levels should be treated with medicines if diet and exercise are not working.  If you smoke, find out from your health care provider how to quit. If you do not use tobacco, do not start.  Lung cancer screening is recommended for adults aged 22-80 years who are at high risk for developing lung cancer because of a history of smoking. A yearly low-dose CT scan of the lungs is recommended for people who have at least a 30-pack-year history of smoking and are a current smoker or have quit within the past 15 years. A pack year of smoking is smoking an average of 1 pack of cigarettes a day for 1 year (for example: 1 pack a day for 30 years or 2 packs a day for 15 years). Yearly screening should continue until the smoker has stopped smoking for at least 15 years. Yearly screening should be stopped for people who develop a health problem that would prevent them from having lung cancer treatment.  If you are pregnant, do not drink alcohol. If you are breastfeeding,  be very cautious about drinking alcohol. If you are not pregnant and choose to drink alcohol, do not have more than 1 drink per day. One drink is considered to be 12 ounces (355 mL) of beer, 5 ounces (148 mL) of wine, or 1.5 ounces (44 mL) of liquor.  Avoid use of street drugs. Do not share needles with anyone. Ask for help if you need support or instructions about stopping the use of drugs.  High blood pressure causes heart disease and increases the risk of  stroke. Your blood pressure should be checked at least every 1 to 2 years. Ongoing high blood pressure should be treated with medicines if weight loss and exercise do not work.  If you are 75-52 years old, ask your health care provider if you should take aspirin to prevent strokes.  Diabetes screening involves taking a blood sample to check your fasting blood sugar level. This should be done once every 3 years, after age 15, if you are within normal weight and without risk factors for diabetes. Testing should be considered at a younger age or be carried out more frequently if you are overweight and have at least 1 risk factor for diabetes.  Breast cancer screening is essential preventive care for women. You should practice "breast self-awareness." This means understanding the normal appearance and feel of your breasts and may include breast self-examination. Any changes detected, no matter how small, should be reported to a health care provider. Women in their 58s and 30s should have a clinical breast exam (CBE) by a health care provider as part of a regular health exam every 1 to 3 years. After age 16, women should have a CBE every year. Starting at age 53, women should consider having a mammogram (breast X-ray test) every year. Women who have a family history of breast cancer should talk to their health care provider about genetic screening. Women at a high risk of breast cancer should talk to their health care providers about having an MRI and a mammogram every year.  Breast cancer gene (BRCA)-related cancer risk assessment is recommended for women who have family members with BRCA-related cancers. BRCA-related cancers include breast, ovarian, tubal, and peritoneal cancers. Having family members with these cancers may be associated with an increased risk for harmful changes (mutations) in the breast cancer genes BRCA1 and BRCA2. Results of the assessment will determine the need for genetic counseling and  BRCA1 and BRCA2 testing.  Routine pelvic exams to screen for cancer are no longer recommended for nonpregnant women who are considered low risk for cancer of the pelvic organs (ovaries, uterus, and vagina) and who do not have symptoms. Ask your health care provider if a screening pelvic exam is right for you.  If you have had past treatment for cervical cancer or a condition that could lead to cancer, you need Pap tests and screening for cancer for at least 20 years after your treatment. If Pap tests have been discontinued, your risk factors (such as having a new sexual partner) need to be reassessed to determine if screening should be resumed. Some women have medical problems that increase the chance of getting cervical cancer. In these cases, your health care provider may recommend more frequent screening and Pap tests.  The HPV test is an additional test that may be used for cervical cancer screening. The HPV test looks for the virus that can cause the cell changes on the cervix. The cells collected during the Pap test can be  tested for HPV. The HPV test could be used to screen women aged 30 years and older, and should be used in women of any age who have unclear Pap test results. After the age of 30, women should have HPV testing at the same frequency as a Pap test.  Colorectal cancer can be detected and often prevented. Most routine colorectal cancer screening begins at the age of 50 years and continues through age 75 years. However, your health care provider may recommend screening at an earlier age if you have risk factors for colon cancer. On a yearly basis, your health care provider may provide home test kits to check for hidden blood in the stool. Use of a small camera at the end of a tube, to directly examine the colon (sigmoidoscopy or colonoscopy), can detect the earliest forms of colorectal cancer. Talk to your health care provider about this at age 50, when routine screening begins. Direct  exam of the colon should be repeated every 5-10 years through age 75 years, unless early forms of pre-cancerous polyps or small growths are found.  People who are at an increased risk for hepatitis B should be screened for this virus. You are considered at high risk for hepatitis B if:  You were born in a country where hepatitis B occurs often. Talk with your health care provider about which countries are considered high risk.  Your parents were born in a high-risk country and you have not received a shot to protect against hepatitis B (hepatitis B vaccine).  You have HIV or AIDS.  You use needles to inject street drugs.  You live with, or have sex with, someone who has hepatitis B.  You get hemodialysis treatment.  You take certain medicines for conditions like cancer, organ transplantation, and autoimmune conditions.  Hepatitis C blood testing is recommended for all people born from 1945 through 1965 and any individual with known risks for hepatitis C.  Practice safe sex. Use condoms and avoid high-risk sexual practices to reduce the spread of sexually transmitted infections (STIs). STIs include gonorrhea, chlamydia, syphilis, trichomonas, herpes, HPV, and human immunodeficiency virus (HIV). Herpes, HIV, and HPV are viral illnesses that have no cure. They can result in disability, cancer, and death.  You should be screened for sexually transmitted illnesses (STIs) including gonorrhea and chlamydia if:  You are sexually active and are younger than 24 years.  You are older than 24 years and your health care provider tells you that you are at risk for this type of infection.  Your sexual activity has changed since you were last screened and you are at an increased risk for chlamydia or gonorrhea. Ask your health care provider if you are at risk.  If you are at risk of being infected with HIV, it is recommended that you take a prescription medicine daily to prevent HIV infection. This is  called preexposure prophylaxis (PrEP). You are considered at risk if:  You are a heterosexual woman, are sexually active, and are at increased risk for HIV infection.  You take drugs by injection.  You are sexually active with a partner who has HIV.  Talk with your health care provider about whether you are at high risk of being infected with HIV. If you choose to begin PrEP, you should first be tested for HIV. You should then be tested every 3 months for as long as you are taking PrEP.  Osteoporosis is a disease in which the bones lose minerals and strength   with aging. This can result in serious bone fractures or breaks. The risk of osteoporosis can be identified using a bone density scan. Women ages 65 years and over and women at risk for fractures or osteoporosis should discuss screening with their health care providers. Ask your health care provider whether you should take a calcium supplement or vitamin D to reduce the rate of osteoporosis.  Menopause can be associated with physical symptoms and risks. Hormone replacement therapy is available to decrease symptoms and risks. You should talk to your health care provider about whether hormone replacement therapy is right for you.  Use sunscreen. Apply sunscreen liberally and repeatedly throughout the day. You should seek shade when your shadow is shorter than you. Protect yourself by wearing long sleeves, pants, a wide-brimmed hat, and sunglasses year round, whenever you are outdoors.  Once a month, do a whole body skin exam, using a mirror to look at the skin on your back. Tell your health care provider of new moles, moles that have irregular borders, moles that are larger than a pencil eraser, or moles that have changed in shape or color.  Stay current with required vaccines (immunizations).  Influenza vaccine. All adults should be immunized every year.  Tetanus, diphtheria, and acellular pertussis (Td, Tdap) vaccine. Pregnant women should  receive 1 dose of Tdap vaccine during each pregnancy. The dose should be obtained regardless of the length of time since the last dose. Immunization is preferred during the 27th-36th week of gestation. An adult who has not previously received Tdap or who does not know her vaccine status should receive 1 dose of Tdap. This initial dose should be followed by tetanus and diphtheria toxoids (Td) booster doses every 10 years. Adults with an unknown or incomplete history of completing a 3-dose immunization series with Td-containing vaccines should begin or complete a primary immunization series including a Tdap dose. Adults should receive a Td booster every 10 years.  Varicella vaccine. An adult without evidence of immunity to varicella should receive 2 doses or a second dose if she has previously received 1 dose. Pregnant females who do not have evidence of immunity should receive the first dose after pregnancy. This first dose should be obtained before leaving the health care facility. The second dose should be obtained 4-8 weeks after the first dose.  Human papillomavirus (HPV) vaccine. Females aged 13-26 years who have not received the vaccine previously should obtain the 3-dose series. The vaccine is not recommended for use in pregnant females. However, pregnancy testing is not needed before receiving a dose. If a female is found to be pregnant after receiving a dose, no treatment is needed. In that case, the remaining doses should be delayed until after the pregnancy. Immunization is recommended for any person with an immunocompromised condition through the age of 26 years if she did not get any or all doses earlier. During the 3-dose series, the second dose should be obtained 4-8 weeks after the first dose. The third dose should be obtained 24 weeks after the first dose and 16 weeks after the second dose.  Zoster vaccine. One dose is recommended for adults aged 60 years or older unless certain conditions are  present.  Measles, mumps, and rubella (MMR) vaccine. Adults born before 1957 generally are considered immune to measles and mumps. Adults born in 1957 or later should have 1 or more doses of MMR vaccine unless there is a contraindication to the vaccine or there is laboratory evidence of immunity to   each of the three diseases. A routine second dose of MMR vaccine should be obtained at least 28 days after the first dose for students attending postsecondary schools, health care workers, or international travelers. People who received inactivated measles vaccine or an unknown type of measles vaccine during 1963-1967 should receive 2 doses of MMR vaccine. People who received inactivated mumps vaccine or an unknown type of mumps vaccine before 1979 and are at high risk for mumps infection should consider immunization with 2 doses of MMR vaccine. For females of childbearing age, rubella immunity should be determined. If there is no evidence of immunity, females who are not pregnant should be vaccinated. If there is no evidence of immunity, females who are pregnant should delay immunization until after pregnancy. Unvaccinated health care workers born before 1957 who lack laboratory evidence of measles, mumps, or rubella immunity or laboratory confirmation of disease should consider measles and mumps immunization with 2 doses of MMR vaccine or rubella immunization with 1 dose of MMR vaccine.  Pneumococcal 13-valent conjugate (PCV13) vaccine. When indicated, a person who is uncertain of her immunization history and has no record of immunization should receive the PCV13 vaccine. An adult aged 19 years or older who has certain medical conditions and has not been previously immunized should receive 1 dose of PCV13 vaccine. This PCV13 should be followed with a dose of pneumococcal polysaccharide (PPSV23) vaccine. The PPSV23 vaccine dose should be obtained at least 8 weeks after the dose of PCV13 vaccine. An adult aged 19  years or older who has certain medical conditions and previously received 1 or more doses of PPSV23 vaccine should receive 1 dose of PCV13. The PCV13 vaccine dose should be obtained 1 or more years after the last PPSV23 vaccine dose.  Pneumococcal polysaccharide (PPSV23) vaccine. When PCV13 is also indicated, PCV13 should be obtained first. All adults aged 65 years and older should be immunized. An adult younger than age 65 years who has certain medical conditions should be immunized. Any person who resides in a nursing home or long-term care facility should be immunized. An adult smoker should be immunized. People with an immunocompromised condition and certain other conditions should receive both PCV13 and PPSV23 vaccines. People with human immunodeficiency virus (HIV) infection should be immunized as soon as possible after diagnosis. Immunization during chemotherapy or radiation therapy should be avoided. Routine use of PPSV23 vaccine is not recommended for American Indians, [place] Natives, or people younger than 65 years unless there are medical conditions that require PPSV23 vaccine. When indicated, people who have unknown immunization and have no record of immunization should receive PPSV23 vaccine. One-time revaccination 5 years after the first dose of PPSV23 is recommended for people aged 19-64 years who have chronic kidney failure, nephrotic syndrome, asplenia, or immunocompromised conditions. People who received 1-2 doses of PPSV23 before age 65 years should receive another dose of PPSV23 vaccine at age 65 years or later if at least 5 years have passed since the previous dose. Doses of PPSV23 are not needed for people immunized with PPSV23 at or after age 65 years.  Meningococcal vaccine. Adults with asplenia or persistent complement component deficiencies should receive 2 doses of quadrivalent meningococcal conjugate (MenACWY-D) vaccine. The doses should be obtained at least 2 months apart.  Microbiologists working with certain meningococcal bacteria, military recruits, people at risk during an outbreak, and people who travel to or live in countries with a high rate of meningitis should be immunized. A first-year college student up through age   21 years who is living in a residence hall should receive a dose if she did not receive a dose on or after her 16th birthday. Adults who have certain high-risk conditions should receive one or more doses of vaccine.  Hepatitis A vaccine. Adults who wish to be protected from this disease, have certain high-risk conditions, work with hepatitis A-infected animals, work in hepatitis A research labs, or travel to or work in countries with a high rate of hepatitis A should be immunized. Adults who were previously unvaccinated and who anticipate close contact with an international adoptee during the first 60 days after arrival in the [place] from a country with a high rate of hepatitis A should be immunized.  Hepatitis B vaccine. Adults who wish to be protected from this disease, have certain high-risk conditions, may be exposed to blood or other infectious body fluids, are household contacts or sex partners of hepatitis B positive people, are clients or workers in certain care facilities, or travel to or work in countries with a high rate of hepatitis B should be immunized.  Haemophilus influenzae type b (Hib) vaccine. A previously unvaccinated person with asplenia or sickle cell disease or having a scheduled splenectomy should receive 1 dose of Hib vaccine. Regardless of previous immunization, a recipient of a hematopoietic stem cell transplant should receive a 3-dose series 6-12 months after her successful transplant. Hib vaccine is not recommended for adults with HIV infection. Preventive Services / Frequency  Ages 19 to 39 years 1. Blood pressure check. 2. Lipid and cholesterol check. 3. Clinical breast exam.** / Every 3 years for women in their  20s and 30s. 4. BRCA-related cancer risk assessment.** / For women who have family members with a BRCA-related cancer (breast, ovarian, tubal, or peritoneal cancers). 5. Pap test.** / Every 2 years from ages 21 through 29. Every 3 years starting at age 30 through age 65 or 70 with a history of 3 consecutive normal Pap tests. 6. HPV screening.** / Every 3 years from ages 30 through ages 65 to 70 with a history of 3 consecutive normal Pap tests. 7. Hepatitis C blood test.** / For any individual with known risks for hepatitis C. 8. Skin self-exam. / Monthly. 9. Influenza vaccine. / Every year. 10. Tetanus, diphtheria, and acellular pertussis (Tdap, Td) vaccine.** / Consult your health care provider. Pregnant women should receive 1 dose of Tdap vaccine during each pregnancy. 1 dose of Td every 10 years. 11. Varicella vaccine.** / Consult your health care provider. Pregnant females who do not have evidence of immunity should receive the first dose after pregnancy. 12. HPV vaccine. / 3 doses over 6 months, if 26 and younger. The vaccine is not recommended for use in pregnant females. However, pregnancy testing is not needed before receiving a dose. 13. Measles, mumps, rubella (MMR) vaccine.** / You need at least 1 dose of MMR if you were born in 1957 or later. You may also need a 2nd dose. For females of childbearing age, rubella immunity should be determined. If there is no evidence of immunity, females who are not pregnant should be vaccinated. If there is no evidence of immunity, females who are pregnant should delay immunization until after pregnancy. 14. Pneumococcal 13-valent conjugate (PCV13) vaccine.** / Consult your health care provider. 15. Pneumococcal polysaccharide (PPSV23) vaccine.** / 1 to 2 doses if you smoke cigarettes or if you have certain conditions. 16. Meningococcal vaccine.** / 1 dose if you are age 19 to 21 years and a   first-year college student living in a residence hall, or have one  of several medical conditions, you need to get vaccinated against meningococcal disease. You may also need additional booster doses. 17. Hepatitis A vaccine.** / Consult your health care provider. 18. Hepatitis B vaccine.** / Consult your health care provider. 19. Haemophilus influenzae type b (Hib) vaccine.** / Consult your health care provider.  24. Chlamydia, HIV, and other sexually transmitted diseases- Get screened every year until age 54, then within three months of each new sexual provider. 21. Pap Smear- Every 1-3 years; discuss with your health care provider. 28. Mammogram- Every year starting at age 40  Take these steps 1. Do not smoke-Your healthcare provider can help you quit.  For tips on how to quit go to www.smokefree.gov or call 1-800 QUITNOW. 2. Be physically active- Exercise 5 days a week for at least 30 minutes.  If you are not already physically active, start slow and gradually work up to 30 minutes of moderate physical activity.  Examples of moderate activity include walking briskly, dancing, swimming, bicycling, etc. 3. Breast Cancer- A self breast exam every month is important for early detection of breast cancer.  For more information and instruction on self breast exams, ask your healthcare provider or https://www.patel.info/. 4. Eat a healthy diet- Eat a variety of healthy foods such as fruits, vegetables, whole grains, low fat milk, low fat cheeses, yogurt, lean meats, poultry and fish, beans, nuts, tofu, etc.  For more information go to www. Thenutritionsource.org 5. Drink alcohol in moderation- Limit alcohol intake to one drink or less per day. Never drink and drive. 6. Depression- Your emotional health is as important as your physical health.  If you're feeling down or losing interest in things you normally enjoy please talk to your healthcare provider about being screened for depression. 7. Dental visit- Brush and floss your teeth twice daily;  visit your dentist twice a year. 8. Eye doctor- Get an eye exam at least every 2 years. 9. Helmet use- Always wear a helmet when riding a bicycle, motorcycle, rollerblading or skateboarding. 6. Safe sex- If you may be exposed to sexually transmitted infections, use a condom. 11. Seat belts- Seat belts can save your live; always wear one. 12. Smoke/Carbon Monoxide detectors- These detectors need to be installed on the appropriate level of your home. Replace batteries at least once a year. 13. Skin cancer- When out in the sun please cover up and use sunscreen 15 SPF or higher. 14. Violence- If anyone is threatening or hurting you, please tell your healthcare provider.

## 2015-01-10 ENCOUNTER — Ambulatory Visit: Payer: Self-pay | Admitting: Physician Assistant

## 2015-02-01 ENCOUNTER — Other Ambulatory Visit: Payer: Self-pay | Admitting: Physician Assistant

## 2015-02-01 DIAGNOSIS — F988 Other specified behavioral and emotional disorders with onset usually occurring in childhood and adolescence: Secondary | ICD-10-CM

## 2015-02-01 MED ORDER — AMPHETAMINE-DEXTROAMPHETAMINE 20 MG PO TABS: ORAL_TABLET | ORAL | Status: DC

## 2015-02-28 ENCOUNTER — Telehealth: Payer: Self-pay

## 2015-02-28 DIAGNOSIS — F988 Other specified behavioral and emotional disorders with onset usually occurring in childhood and adolescence: Secondary | ICD-10-CM

## 2015-02-28 MED ORDER — AMPHETAMINE-DEXTROAMPHETAMINE 20 MG PO TABS: ORAL_TABLET | ORAL | Status: DC

## 2015-02-28 NOTE — Telephone Encounter (Signed)
Received request for refill of ADHD Medication. COLLIER, AMANDA PA-C  Current Outpatient Prescriptions  Medication Sig Dispense Refill  . albuterol (PROVENTIL HFA;VENTOLIN HFA) 108 (90 BASE) MCG/ACT inhaler Inhale 2 puffs into the lungs every 6 (six) hours as needed for wheezing or shortness of breath. 1 Inhaler 2  . amphetamine-dextroamphetamine (ADDERALL) 20 MG tablet Take 1/2 to 1 tablet 2 x day if needed for ADD 60 tablet 0  . drospirenone-ethinyl estradiol (YAZ,GIANVI,LORYNA) 3-0.02 MG tablet Take 1 tablet by mouth daily. 1 Package 11  . montelukast (SINGULAIR) 10 MG tablet Take 1 tablet (10 mg total) by mouth daily. 30 tablet 2   No current facility-administered medications for this visit.     Last OV- 01/03/15 CPE NO FUTURE APPTS SCHEDULED.  Please call pt when script is ready to p/u

## 2015-03-01 NOTE — Telephone Encounter (Signed)
PATIENT AWARE

## 2015-03-28 ENCOUNTER — Other Ambulatory Visit: Payer: Self-pay | Admitting: Physician Assistant

## 2015-03-28 DIAGNOSIS — F988 Other specified behavioral and emotional disorders with onset usually occurring in childhood and adolescence: Secondary | ICD-10-CM

## 2015-03-28 MED ORDER — AMPHETAMINE-DEXTROAMPHETAMINE 20 MG PO TABS: ORAL_TABLET | ORAL | Status: DC

## 2015-04-27 ENCOUNTER — Other Ambulatory Visit: Payer: Self-pay | Admitting: Physician Assistant

## 2015-04-27 DIAGNOSIS — F988 Other specified behavioral and emotional disorders with onset usually occurring in childhood and adolescence: Secondary | ICD-10-CM

## 2015-04-27 MED ORDER — AMPHETAMINE-DEXTROAMPHETAMINE 20 MG PO TABS: ORAL_TABLET | ORAL | Status: DC

## 2015-05-26 ENCOUNTER — Other Ambulatory Visit: Payer: Self-pay | Admitting: Physician Assistant

## 2015-05-26 DIAGNOSIS — F988 Other specified behavioral and emotional disorders with onset usually occurring in childhood and adolescence: Secondary | ICD-10-CM

## 2015-05-26 MED ORDER — AMPHETAMINE-DEXTROAMPHETAMINE 20 MG PO TABS: ORAL_TABLET | ORAL | Status: DC

## 2015-06-24 ENCOUNTER — Other Ambulatory Visit: Payer: Self-pay | Admitting: Physician Assistant

## 2015-06-24 DIAGNOSIS — F988 Other specified behavioral and emotional disorders with onset usually occurring in childhood and adolescence: Secondary | ICD-10-CM

## 2015-06-24 MED ORDER — AMPHETAMINE-DEXTROAMPHETAMINE 20 MG PO TABS: ORAL_TABLET | ORAL | Status: DC

## 2015-07-01 ENCOUNTER — Ambulatory Visit: Payer: Self-pay | Admitting: Physician Assistant

## 2015-07-21 ENCOUNTER — Other Ambulatory Visit: Payer: Self-pay | Admitting: Physician Assistant

## 2015-07-21 DIAGNOSIS — F988 Other specified behavioral and emotional disorders with onset usually occurring in childhood and adolescence: Secondary | ICD-10-CM

## 2015-07-21 MED ORDER — AMPHETAMINE-DEXTROAMPHETAMINE 20 MG PO TABS: ORAL_TABLET | ORAL | Status: DC

## 2015-08-22 ENCOUNTER — Ambulatory Visit: Payer: Self-pay | Admitting: Physician Assistant

## 2015-08-24 ENCOUNTER — Other Ambulatory Visit (HOSPITAL_COMMUNITY)
Admission: RE | Admit: 2015-08-24 | Discharge: 2015-08-24 | Disposition: A | Discharge status: Home / Self Care | Payer: No Typology Code available for payment source | Source: Ambulatory Visit | Attending: Internal Medicine | Admitting: Internal Medicine

## 2015-08-24 ENCOUNTER — Ambulatory Visit (INDEPENDENT_AMBULATORY_CARE_PROVIDER_SITE_OTHER): Payer: Self-pay | Admitting: Physician Assistant

## 2015-08-24 ENCOUNTER — Encounter: Payer: Self-pay | Admitting: Physician Assistant

## 2015-08-24 VITALS — BP 140/90 | HR 116 | Temp 97.7°F | Resp 16 | Ht 65.5 in | Wt 140.4 lb

## 2015-08-24 DIAGNOSIS — F988 Other specified behavioral and emotional disorders with onset usually occurring in childhood and adolescence: Secondary | ICD-10-CM

## 2015-08-24 DIAGNOSIS — Z01419 Encounter for gynecological examination (general) (routine) without abnormal findings: Secondary | ICD-10-CM | POA: Insufficient documentation

## 2015-08-24 DIAGNOSIS — J45909 Unspecified asthma, uncomplicated: Secondary | ICD-10-CM

## 2015-08-24 DIAGNOSIS — Z124 Encounter for screening for malignant neoplasm of cervix: Secondary | ICD-10-CM

## 2015-08-24 DIAGNOSIS — Z79899 Other long term (current) drug therapy: Secondary | ICD-10-CM

## 2015-08-24 DIAGNOSIS — F909 Attention-deficit hyperactivity disorder, unspecified type: Secondary | ICD-10-CM

## 2015-08-24 DIAGNOSIS — E559 Vitamin D deficiency, unspecified: Secondary | ICD-10-CM

## 2015-08-24 MED ORDER — FLUCONAZOLE 150 MG PO TABS: 150.0000 mg | ORAL_TABLET | Freq: Every day | ORAL | Status: DC

## 2015-08-24 MED ORDER — AMPHETAMINE-DEXTROAMPHETAMINE 20 MG PO TABS: ORAL_TABLET | ORAL | Status: DC

## 2015-08-24 NOTE — Progress Notes (Signed)
Follow up.   Assessment and Plan: Cervical cancer screen- Sent off ADD- prescription given Asthma- get on allergy pill, mucinex, and use albuterol, will check next week tetanus  Discussed med's effects and SE's. Screening labs and tests as requested with regular follow-up as recommended. Over 40 minutes of exam, counseling, chart review and critical decision making was performed  HPI  This very nice 30 y.o.female, does not have insurance, has not had pap in a very long time.  Took ABX long time ago, treated OTC yeast infection, no issues at this time.   She does workout.  Finally, patient has history of Vitamin D Deficiency and last vitamin D.  Currently on supplementation.  She had a CPE in august and was going to get a PAP with GYN but did not follow up, she is here today for a PAP.  She is on adderall for ADD.  She has allergies and asthma.   Current Medications:  Current Outpatient Prescriptions on File Prior to Visit  Medication Sig Dispense Refill  . albuterol (PROVENTIL HFA;VENTOLIN HFA) 108 (90 BASE) MCG/ACT inhaler Inhale 2 puffs into the lungs every 6 (six) hours as needed for wheezing or shortness of breath. 1 Inhaler 2  . amphetamine-dextroamphetamine (ADDERALL) 20 MG tablet Take 1/2 to 1 tablet 2 x day if needed for ADD 60 tablet 0  . drospirenone-ethinyl estradiol (YAZ,GIANVI,LORYNA) 3-0.02 MG tablet Take 1 tablet by mouth daily. 1 Package 11  . montelukast (SINGULAIR) 10 MG tablet Take 1 tablet (10 mg total) by mouth daily. 30 tablet 2   No current facility-administered medications on file prior to visit.   Health Maintenance:   Immunization History  Administered Date(s) Administered  . DT 01/03/2015  . Td 03/31/2004   Allergies: No Known Allergies Medical History:  Past Medical History  Diagnosis Date  . Asthma   . ADD (attention deficit disorder)   . ADHD (attention deficit hyperactivity disorder)   . Asthma due to seasonal allergies 07/29/2013   Surgical  History:  Past Surgical History  Procedure Laterality Date  . Tonsillectomy  2015   Family History:  Family History  Problem Relation Age of Onset  . Diabetes Mother    Social History:  Social History  Substance Use Topics  . Smoking status: Never Smoker   . Smokeless tobacco: Never Used  . Alcohol Use: No   Review of Systems: Review of Systems  Constitutional: Negative.  Negative for fever and chills.  HENT: Negative.   Eyes: Negative.   Respiratory: Positive for cough. Negative for hemoptysis, sputum production, shortness of breath and wheezing.   Cardiovascular: Negative.   Gastrointestinal: Negative.   Genitourinary: Negative.   Musculoskeletal: Negative.   Skin: Negative.   Neurological: Negative.   Psychiatric/Behavioral: Negative.     Physical Exam: Estimated body mass index is 23 kg/(m^2) as calculated from the following:   Height as of this encounter: 5' 5.5" (1.664 m).   Weight as of this encounter: 140 lb 6.4 oz (63.685 kg). Ht 5' 5.5" (1.664 m)  Wt 140 lb 6.4 oz (63.685 kg)  BMI 23.00 kg/m2  LMP 08/03/2015 General Appearance: Well nourished, in no apparent distress.  Eyes: PERRLA, EOMs, conjunctiva no swelling or erythema, normal fundi and vessels.  Sinuses: No Frontal/maxillary tenderness  ENT/Mouth: Ext aud canals clear, normal light reflex with TMs without erythema, bulging. Good dentition. No erythema, swelling, or exudate on post pharynx. Tonsils not swollen or erythematous. Hearing normal.  Neck: Supple, thyroid normal. No bruits  Respiratory: Respiratory effort normal, BS equal bilaterally without rales, rhonchi, wheezing or stridor.  Cardio: RRR without murmurs, rubs or gallops. Brisk peripheral pulses without edema.  Chest: symmetric, with normal excursions and percussion.  Breasts: will get next week.  Abdomen: Soft, nontender, no guarding, rebound, hernias, masses, or organomegaly.  Lymphatics: Non tender without lymphadenopathy.   Genitourinary: will get next week Musculoskeletal: Full ROM all peripheral extremities,5/5 strength, and normal gait.  Skin: Warm, dry without rashes, lesions, ecchymosis. Neuro: Cranial nerves intact, reflexes equal bilaterally. Normal muscle tone, no cerebellar symptoms. Sensation intact.  Psych: Awake and oriented X 3, normal affect, Insight and Judgment appropriate.   EKG: defer  Quentin Mulling 3:19 PM [facility]

## 2015-08-24 NOTE — Patient Instructions (Signed)
[facility] with [facility] cost of IUD/nexplanon Will be 3-5 years BCP Levonorgestrel intrauterine device (IUD) What is this medicine? LEVONORGESTREL IUD (LEE voe nor jes trel) is a contraceptive (birth control) device. The device is placed inside the uterus by a healthcare professional. It is used to prevent pregnancy and can also be used to treat heavy bleeding that occurs during your period. Depending on the device, it can be used for 3 to 5 years. This medicine may be used for other purposes; ask your health care provider or pharmacist if you have questions. What should I tell my health care provider before I take this medicine? They need to know if you have any of these conditions: -abnormal Pap smear -cancer of the breast, uterus, or cervix -diabetes -endometritis -genital or pelvic infection now or in the past -have more than one sexual partner or your partner has more than one partner -heart disease -history of an ectopic or tubal pregnancy -immune system problems -IUD in place -liver disease or tumor -problems with blood clots or take blood-thinners -use intravenous drugs -uterus of unusual shape -vaginal bleeding that has not been explained -an unusual or allergic reaction to levonorgestrel, other hormones, silicone, or polyethylene, medicines, foods, dyes, or preservatives -pregnant or trying to get pregnant -breast-feeding How should I use this medicine? This device is placed inside the uterus by a health care professional. Talk to your pediatrician regarding the use of this medicine in children. Special care may be needed. Overdosage: If you think you have taken too much of this medicine contact a poison control center or emergency room at once. NOTE: This medicine is only for you. Do not share this medicine with others. What if I miss a dose? This does not apply. What may interact with this medicine? Do not take this medicine with any of the  following medications: -amprenavir -bosentan -fosamprenavir This medicine may also interact with the following medications: -aprepitant -barbiturate medicines for inducing sleep or treating seizures -bexarotene -griseofulvin -medicines to treat seizures like carbamazepine, ethotoin, felbamate, oxcarbazepine, phenytoin, topiramate -modafinil -pioglitazone -rifabutin -rifampin -rifapentine -some medicines to treat HIV infection like atazanavir, indinavir, lopinavir, nelfinavir, tipranavir, ritonavir -St. John's wort -warfarin This list may not describe all possible interactions. Give your health care provider a list of all the medicines, herbs, non-prescription drugs, or dietary supplements you use. Also tell them if you smoke, drink alcohol, or use illegal drugs. Some items may interact with your medicine. What should I watch for while using this medicine? Visit your doctor or health care professional for regular check ups. See your doctor if you or your partner has sexual contact with others, becomes HIV positive, or gets a sexual transmitted disease. This product does not protect you against HIV infection (AIDS) or other sexually transmitted diseases. You can check the placement of the IUD yourself by reaching up to the top of your vagina with clean fingers to feel the threads. Do not pull on the threads. It is a good habit to check placement after each menstrual period. Call your doctor right away if you feel more of the IUD than just the threads or if you cannot feel the threads at all. The IUD may come out by itself. You may become pregnant if the device comes out. If you notice that the IUD has come out use a backup birth control method like condoms and call your health care provider. Using tampons will not change the position of the IUD and are okay to  use during your period. What side effects may I notice from receiving this medicine? Side effects that you should report to your  doctor or health care professional as soon as possible: -allergic reactions like skin rash, itching or hives, swelling of the face, lips, or tongue -fever, flu-like symptoms -genital sores -high blood pressure -no menstrual period for 6 weeks during use -pain, swelling, warmth in the leg -pelvic pain or tenderness -severe or sudden headache -signs of pregnancy -stomach cramping -sudden shortness of breath -trouble with balance, talking, or walking -unusual vaginal bleeding, discharge -yellowing of the eyes or skin Side effects that usually do not require medical attention (report to your doctor or health care professional if they continue or are bothersome): -acne -breast pain -change in sex drive or performance -changes in weight -cramping, dizziness, or faintness while the device is being inserted -headache -irregular menstrual bleeding within first 3 to 6 months of use -nausea This list may not describe all possible side effects. Call your doctor for medical advice about side effects. You may report side effects to FDA at 1-800-FDA-1088. Where should I keep my medicine? This does not apply. NOTE: This sheet is a summary. It may not cover all possible information. If you have questions about this medicine, talk to your doctor, pharmacist, or health care provider.    2016, Elsevier/Gold Standard. (2011-05-31 13:54:04) Etonogestrel implant What is this medicine? ETONOGESTREL (et oh noe JES trel) is a contraceptive (birth control) device. It is used to prevent pregnancy. It can be used for up to 3 years. This medicine may be used for other purposes; ask your health care provider or pharmacist if you have questions. What should I tell my health care provider before I take this medicine? They need to know if you have any of these conditions: -abnormal vaginal bleeding -blood vessel disease or blood clots -cancer of the breast, cervix, or liver -depression -diabetes -gallbladder  disease -headaches -heart disease or recent heart attack -high blood pressure -high cholesterol -kidney disease -liver disease -renal disease -seizures -tobacco smoker -an unusual or allergic reaction to etonogestrel, other hormones, anesthetics or antiseptics, medicines, foods, dyes, or preservatives -pregnant or trying to get pregnant -breast-feeding How should I use this medicine? This device is inserted just under the skin on the inner side of your upper arm by a health care professional. Talk to your pediatrician regarding the use of this medicine in children. Special care may be needed. Overdosage: If you think you have taken too much of this medicine contact a poison control center or emergency room at once. NOTE: This medicine is only for you. Do not share this medicine with others. What if I miss a dose? This does not apply. What may interact with this medicine? Do not take this medicine with any of the following medications: -amprenavir -bosentan -fosamprenavir This medicine may also interact with the following medications: -barbiturate medicines for inducing sleep or treating seizures -certain medicines for fungal infections like ketoconazole and itraconazole -griseofulvin -medicines to treat seizures like carbamazepine, felbamate, oxcarbazepine, phenytoin, topiramate -modafinil -phenylbutazone -rifampin -some medicines to treat HIV infection like atazanavir, indinavir, lopinavir, nelfinavir, tipranavir, ritonavir -St. John's wort This list may not describe all possible interactions. Give your health care provider a list of all the medicines, herbs, non-prescription drugs, or dietary supplements you use. Also tell them if you smoke, drink alcohol, or use illegal drugs. Some items may interact with your medicine. What should I watch for while using this medicine? This product does  not protect you against HIV infection (AIDS) or other sexually transmitted diseases. You  should be able to feel the implant by pressing your fingertips over the skin where it was inserted. Contact your doctor if you cannot feel the implant, and use a non-hormonal birth control method (such as condoms) until your doctor confirms that the implant is in place. If you feel that the implant may have broken or become bent while in your arm, contact your healthcare provider. What side effects may I notice from receiving this medicine? Side effects that you should report to your doctor or health care professional as soon as possible: -allergic reactions like skin rash, itching or hives, swelling of the face, lips, or tongue -breast lumps -changes in emotions or moods -depressed mood -heavy or prolonged menstrual bleeding -pain, irritation, swelling, or bruising at the insertion site -scar at site of insertion -signs of infection at the insertion site such as fever, and skin redness, pain or discharge -signs of pregnancy -signs and symptoms of a blood clot such as breathing problems; changes in vision; chest pain; severe, sudden headache; pain, swelling, warmth in the leg; trouble speaking; sudden numbness or weakness of the face, arm or leg -signs and symptoms of liver injury like dark yellow or brown urine; general ill feeling or flu-like symptoms; light-colored stools; loss of appetite; nausea; right upper belly pain; unusually weak or tired; yellowing of the eyes or skin -unusual vaginal bleeding, discharge -signs and symptoms of a stroke like changes in vision; confusion; trouble speaking or understanding; severe headaches; sudden numbness or weakness of the face, arm or leg; trouble walking; dizziness; loss of balance or coordination Side effects that usually do not require medical attention (Report these to your doctor or health care professional if they continue or are bothersome.): -acne -back pain -breast pain -changes in weight -dizziness -general ill feeling or flu-like  symptoms -headache -irregular menstrual bleeding -nausea -sore throat -vaginal irritation or inflammation This list may not describe all possible side effects. Call your doctor for medical advice about side effects. You may report side effects to FDA at 1-800-FDA-1088. Where should I keep my medicine? This drug is given in a hospital or clinic and will not be stored at home. NOTE: This sheet is a summary. It may not cover all possible information. If you have questions about this medicine, talk to your doctor, pharmacist, or health care provider.    2016, Elsevier/Gold Standard. (2014-02-12 14:07:06)

## 2015-08-26 LAB — CYTOLOGY - PAP

## 2015-09-27 ENCOUNTER — Other Ambulatory Visit: Payer: Self-pay | Admitting: *Deleted

## 2015-09-27 DIAGNOSIS — F988 Other specified behavioral and emotional disorders with onset usually occurring in childhood and adolescence: Secondary | ICD-10-CM

## 2015-09-27 MED ORDER — AMPHETAMINE-DEXTROAMPHETAMINE 20 MG PO TABS: ORAL_TABLET | ORAL | Status: DC

## 2015-10-31 ENCOUNTER — Other Ambulatory Visit: Payer: Self-pay | Admitting: Physician Assistant

## 2015-10-31 DIAGNOSIS — F988 Other specified behavioral and emotional disorders with onset usually occurring in childhood and adolescence: Secondary | ICD-10-CM

## 2015-10-31 MED ORDER — AMPHETAMINE-DEXTROAMPHETAMINE 20 MG PO TABS: ORAL_TABLET | ORAL | Status: DC

## 2015-12-01 ENCOUNTER — Other Ambulatory Visit: Payer: Self-pay | Admitting: Physician Assistant

## 2015-12-01 DIAGNOSIS — F988 Other specified behavioral and emotional disorders with onset usually occurring in childhood and adolescence: Secondary | ICD-10-CM

## 2015-12-01 MED ORDER — AMPHETAMINE-DEXTROAMPHETAMINE 20 MG PO TABS: ORAL_TABLET | ORAL | Status: DC

## 2016-01-17 ENCOUNTER — Other Ambulatory Visit: Payer: Self-pay | Admitting: Physician Assistant

## 2016-01-17 DIAGNOSIS — F988 Other specified behavioral and emotional disorders with onset usually occurring in childhood and adolescence: Secondary | ICD-10-CM

## 2016-01-17 MED ORDER — AMPHETAMINE-DEXTROAMPHETAMINE 20 MG PO TABS: ORAL_TABLET | ORAL | 0 refills | Status: DC

## 2016-02-21 ENCOUNTER — Other Ambulatory Visit: Payer: Self-pay | Admitting: Physician Assistant

## 2016-02-21 DIAGNOSIS — F988 Other specified behavioral and emotional disorders with onset usually occurring in childhood and adolescence: Secondary | ICD-10-CM

## 2016-02-21 MED ORDER — AMPHETAMINE-DEXTROAMPHETAMINE 20 MG PO TABS: ORAL_TABLET | ORAL | 0 refills | Status: DC

## 2016-03-27 ENCOUNTER — Other Ambulatory Visit: Payer: Self-pay | Admitting: Physician Assistant

## 2016-03-27 DIAGNOSIS — F988 Other specified behavioral and emotional disorders with onset usually occurring in childhood and adolescence: Secondary | ICD-10-CM

## 2016-03-27 MED ORDER — AMPHETAMINE-DEXTROAMPHETAMINE 20 MG PO TABS: ORAL_TABLET | ORAL | 0 refills | Status: DC

## 2016-04-03 ENCOUNTER — Encounter: Payer: Self-pay | Admitting: Physician Assistant

## 2016-05-02 ENCOUNTER — Encounter: Payer: Self-pay | Admitting: Physician Assistant

## 2016-05-02 NOTE — Progress Notes (Deleted)
Complete Physical  Assessment and Plan: Health Maintenance- Discussed STD testing, safe sex, alcohol and drug awareness, drinking and driving dangers, wearing a seat belt and general safety measures for young adult. Cervical cancer screen- will get PAP, will schedule sometime next week ADD- prescription given Asthma- get on allergy pill, mucinex, and use albuterol, will check next week tetanus  Discussed med's effects and SE's. Screening labs and tests as requested with regular follow-up as recommended. Over 40 minutes of exam, counseling, chart review and critical decision making was performed  HPI  This very nice 30 y.o.female presents for complete physical.  Patient has no major health issues.  Patient reports no complaints at this time.  She does workout.  Finally, patient has history of Vitamin D Deficiency and last vitamin D.  Currently on supplementation.  She is on adderall for ADD.  [place] but is back.  She has allergies and asthma, states she had a zpak a month and half ago and continues to have cough, day and night, some mucus. No fever/chills.   Current Medications:  Current Outpatient Prescriptions on File Prior to Visit  Medication Sig Dispense Refill  . albuterol (PROVENTIL HFA;VENTOLIN HFA) 108 (90 BASE) MCG/ACT inhaler Inhale 2 puffs into the lungs every 6 (six) hours as needed for wheezing or shortness of breath. 1 Inhaler 2  . amphetamine-dextroamphetamine (ADDERALL) 20 MG tablet Take 1/2 to 1 tablet 2 x day if needed for ADD, Need to not take daily, RX should last longer than 1 month 60 tablet 0  . drospirenone-ethinyl estradiol (YAZ,GIANVI,LORYNA) 3-0.02 MG tablet Take 1 tablet by mouth daily. 1 Package 11  . fluconazole (DIFLUCAN) 150 MG tablet Take 1 tablet (150 mg total) by mouth daily. 1 tablet 3  . montelukast (SINGULAIR) 10 MG tablet Take 1 tablet (10 mg total) by mouth daily. 30 tablet 2   No current facility-administered  medications on file prior to visit.    Health Maintenance:   Immunization History  Administered Date(s) Administered  . DT 01/03/2015  . Td 03/31/2004    TD/TDAP: 2005 DUE Influenza: N/A Pneumovax: N/A Prevnar 13: N/A HPV vaccine: N/A  LMP: 2 weeks ago Sexually Active: yes STD testing offered, declines Pap: unknown MGM:  N/A Last Dental Exam: N/A Last Eye Exam: N/A  Allergies: No Known Allergies Medical History:  Past Medical History:  Diagnosis Date  . ADD (attention deficit disorder)   . ADHD (attention deficit hyperactivity disorder)   . Asthma   . Asthma due to seasonal allergies 07/29/2013   Surgical History:  Past Surgical History:  Procedure Laterality Date  . TONSILLECTOMY  2015   Family History:  Family History  Problem Relation Age of Onset  . Diabetes Mother    Social History:  Social History  Substance Use Topics  . Smoking status: Never Smoker  . Smokeless tobacco: Never Used  . Alcohol use No   Review of Systems: Review of Systems  Constitutional: Negative.  Negative for chills and fever.  HENT: Negative.   Eyes: Negative.   Respiratory: Positive for cough. Negative for hemoptysis, sputum production, shortness of breath and wheezing.   Cardiovascular: Negative.   Gastrointestinal: Negative.   Genitourinary: Negative.   Musculoskeletal: Negative.   Skin: Negative.   Neurological: Negative.   Psychiatric/Behavioral: Negative.     Physical Exam: Estimated body mass index is 23.01 kg/m as calculated from the following:   Height as of 08/24/15: 5' 5.5" (1.664 m).  Weight as of 08/24/15: 140 lb 6.4 oz (63.7 kg). There were no vitals taken for this visit. General Appearance: Well nourished, in no apparent distress.  Eyes: PERRLA, EOMs, conjunctiva no swelling or erythema, normal fundi and vessels.  Sinuses: No Frontal/maxillary tenderness  ENT/Mouth: Ext aud canals clear, normal light reflex with TMs without erythema, bulging. Good  dentition. No erythema, swelling, or exudate on post pharynx. Tonsils not swollen or erythematous. Hearing normal.  Neck: Supple, thyroid normal. No bruits  Respiratory: Respiratory effort normal, BS equal bilaterally without rales, rhonchi, wheezing or stridor.  Cardio: RRR without murmurs, rubs or gallops. Brisk peripheral pulses without edema.  Chest: symmetric, with normal excursions and percussion.  Breasts: will get next week.  Abdomen: Soft, nontender, no guarding, rebound, hernias, masses, or organomegaly.  Lymphatics: Non tender without lymphadenopathy.  Genitourinary: will get next week Musculoskeletal: Full ROM all peripheral extremities,5/5 strength, and normal gait.  Skin: Warm, dry without rashes, lesions, ecchymosis. Neuro: Cranial nerves intact, reflexes equal bilaterally. Normal muscle tone, no cerebellar symptoms. Sensation intact.  Psych: Awake and oriented X 3, normal affect, Insight and Judgment appropriate.   EKG: defer  Quentin MullingAmanda Melrose Kearse 8:41 AM [facility]

## 2016-05-21 ENCOUNTER — Other Ambulatory Visit: Payer: Self-pay | Admitting: Physician Assistant

## 2016-05-21 DIAGNOSIS — F988 Other specified behavioral and emotional disorders with onset usually occurring in childhood and adolescence: Secondary | ICD-10-CM

## 2016-05-21 MED ORDER — AMPHETAMINE-DEXTROAMPHETAMINE 20 MG PO TABS: ORAL_TABLET | ORAL | 0 refills | Status: DC

## 2016-07-12 ENCOUNTER — Encounter: Payer: Self-pay | Admitting: Physician Assistant

## 2016-07-12 ENCOUNTER — Ambulatory Visit (INDEPENDENT_AMBULATORY_CARE_PROVIDER_SITE_OTHER): Payer: Self-pay | Admitting: Physician Assistant

## 2016-07-12 VITALS — BP 120/64 | HR 79 | Temp 97.7°F | Resp 16 | Ht 65.5 in | Wt 159.0 lb

## 2016-07-12 DIAGNOSIS — J209 Acute bronchitis, unspecified: Secondary | ICD-10-CM

## 2016-07-12 DIAGNOSIS — F988 Other specified behavioral and emotional disorders with onset usually occurring in childhood and adolescence: Secondary | ICD-10-CM

## 2016-07-12 DIAGNOSIS — J45909 Unspecified asthma, uncomplicated: Secondary | ICD-10-CM

## 2016-07-12 DIAGNOSIS — Z79899 Other long term (current) drug therapy: Secondary | ICD-10-CM

## 2016-07-12 DIAGNOSIS — E559 Vitamin D deficiency, unspecified: Secondary | ICD-10-CM

## 2016-07-12 MED ORDER — AMPHETAMINE-DEXTROAMPHETAMINE 20 MG PO TABS: ORAL_TABLET | ORAL | 0 refills | Status: DC

## 2016-07-12 MED ORDER — BENZONATATE 100 MG PO CAPS: 200.0000 mg | ORAL_CAPSULE | Freq: Three times a day (TID) | ORAL | 0 refills | Status: DC | PRN

## 2016-07-12 MED ORDER — ALBUTEROL SULFATE HFA 108 (90 BASE) MCG/ACT IN AERS: 2.0000 | INHALATION_SPRAY | Freq: Four times a day (QID) | RESPIRATORY_TRACT | 2 refills | Status: AC | PRN

## 2016-07-12 MED ORDER — MONTELUKAST SODIUM 10 MG PO TABS: 10.0000 mg | ORAL_TABLET | Freq: Every day | ORAL | 2 refills | Status: DC

## 2016-07-12 MED ORDER — AZITHROMYCIN 250 MG PO TABS: ORAL_TABLET | ORAL | 1 refills | Status: AC

## 2016-07-12 NOTE — Progress Notes (Signed)
Follow up  Assessment and Plan: Asthma due to seasonal allergies - montelukast (SINGULAIR) 10 MG tablet; Take 1 tablet (10 mg total) by mouth daily.  Dispense: 30 tablet; Refill: 2 - albuterol (PROVENTIL HFA;VENTOLIN HFA) 108 (90 Base) MCG/ACT inhaler; Inhale 2 puffs into the lungs every 6 (six) hours as needed for wheezing or shortness of breath.  Dispense: 1 Inhaler; Refill: 2   Attention deficit disorder, unspecified hyperactivity presence - amphetamine-dextroamphetamine (ADDERALL) 20 MG tablet; Take 1/2 to 1 tablet 2 x day if needed for ADD, Need to not take daily, RX should last longer than 1 month  Dispense: 60 tablet; Refill: 0   Acute bronchitis, unspecified organism Allergy pill, albuterol inhaler - azithromycin (ZITHROMAX) 250 MG tablet; Take 2 tablets (500 mg) on  Day 1,  followed by 1 tablet (250 mg) once daily on Days 2 through 5.  Dispense: 6 each; Refill: 1  Schedule CPE Discussed med's effects and SE's. Screening labs and tests as requested with regular follow-up as recommended.   HPI  This very nice 31 y.o.female presents for follow up. 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  Patient has no major health issues.  Patient reports no complaints at this time.  Has allergies and asthma, tried mucinex, dayquil. Has had cough x 2-3 months.  She does workout.  Finally, patient has history of Vitamin D Deficiency and last vitamin D.  Currently on supplementation.  She is on adderall for ADD.   Current Medications:  Current Outpatient Prescriptions on File Prior to Visit  Medication Sig Dispense Refill  . albuterol (PROVENTIL HFA;VENTOLIN HFA) 108 (90 BASE) MCG/ACT inhaler Inhale 2 puffs into the lungs every 6 (six) hours as needed for wheezing or shortness of breath. 1 Inhaler 2  . amphetamine-dextroamphetamine (ADDERALL) 20 MG tablet Take 1/2 to 1 tablet 2 x day if needed for ADD, Need to not take daily, RX  should last longer than 1 month 60 tablet 0  . drospirenone-ethinyl estradiol (YAZ,GIANVI,LORYNA) 3-0.02 MG tablet Take 1 tablet by mouth daily. 1 Package 11  . fluconazole (DIFLUCAN) 150 MG tablet Take 1 tablet (150 mg total) by mouth daily. 1 tablet 3  . montelukast (SINGULAIR) 10 MG tablet Take 1 tablet (10 mg total) by mouth daily. 30 tablet 2   No current facility-administered medications on file prior to visit.    Review of Systems: Review of Systems  Constitutional: Negative.  Negative for chills and fever.  HENT: Negative.   Eyes: Negative.   Respiratory: Positive for cough. Negative for hemoptysis, sputum production, shortness of breath and wheezing.   Cardiovascular: Negative.   Gastrointestinal: Negative.   Genitourinary: Negative.   Musculoskeletal: Negative.   Skin: Negative.   Neurological: Negative.   Psychiatric/Behavioral: Negative.     Physical Exam: Estimated body mass index is 23.01 kg/m as calculated from the following:   Height as of 08/24/15: 5' 5.5" (1.664 m).   Weight as of 08/24/15: 140 lb 6.4 oz (63.7 kg). There were no vitals taken for this visit. General Appearance: Well nourished, in no apparent distress.  Eyes: PERRLA, EOMs, conjunctiva no swelling or erythema, normal fundi and vessels.  Sinuses: No Frontal/maxillary tenderness  ENT/Mouth: Ext aud canals clear, normal light reflex with TMs without erythema, bulging. Good dentition. No erythema, swelling, or exudate on post pharynx. Tonsils not swollen or erythematous. Hearing normal.  Neck: Supple, thyroid normal. No bruits  Respiratory: Respiratory effort  normal, BS equal bilaterally without rales, rhonchi, wheezing or stridor.  Cardio: RRR without murmurs, rubs or gallops. Brisk peripheral pulses without edema.  Chest: symmetric, with normal excursions and percussion.  Breasts: will get next week.  Abdomen: Soft, nontender, no guarding, rebound, hernias, masses, or organomegaly.  Lymphatics: Non  tender without lymphadenopathy.  Genitourinary: will get next week Musculoskeletal: Full ROM all peripheral extremities,5/5 strength, and normal gait.  Skin: Warm, dry without rashes, lesions, ecchymosis. Neuro: Cranial nerves intact, reflexes equal bilaterally. Normal muscle tone, no cerebellar symptoms. Sensation intact.  Psych: Awake and oriented X 3, normal affect, Insight and Judgment appropriate.    Quentin MullingAmanda Teague Goynes 1:44 PM [facility]

## 2016-07-12 NOTE — Patient Instructions (Signed)
Please pick one of the over the counter allergy medications below and take it once daily for allergies.  Claritin or loratadine cheapest but likely the weakest  Zyrtec or certizine at night because it can make you sleepy The strongest is allegra or fexafinadine  Cheapest at [facility], [employer], [employer]  Can do samples steroid inhaler if do not tolerate oral steroids, do 1 puff twice a day and wash mouth out afterwards to avoid yeast.   HOW TO TREAT VIRAL COUGH AND COLD SYMPTOMS:  -Symptoms usually last at least 1 week with the worst symptoms being around day 4.  - colds usually start with a sore throat and end with a cough, and the cough can take 2 weeks to get better.  -No antibiotics are needed for colds, flu, sore throats, cough, bronchitis UNLESS symptoms are longer than 7 days OR if you are getting better then get drastically worse.  -There are a lot of combination medications (Dayquil, Nyquil, Vicks 44, tyelnol cold and sinus, ETC). Please look at the ingredients on the back so that you are treating the correct symptoms and not doubling up on medications/ingredients.    Medicines you can use  Nasal congestion  - pseudoephedrine (Sudafed)- behind the counter, do not use if you have high blood pressure, medicine that have -D in them.  - phenylephrine (Sudafed PE) -Dextormethorphan + chlorpheniramine (Coridcidin HBP)- okay if you have high blood pressure -Oxymetazoline (Afrin) nasal spray- LIMIT to 3 days -Saline nasal spray -Neti pot (used distilled or bottled water)  Ear pain/congestion  -pseudoephedrine (sudafed) - Nasonex/flonase nasal spray  Fever  -Acetaminophen (Tyelnol) -Ibuprofen (Advil, motrin, aleve)  Sore Throat  -Acetaminophen (Tyelnol) -Ibuprofen (Advil, motrin, aleve) -Drink a lot of water -Gargle with salt water - Rest your voice (don't talk) -Throat sprays -Cough drops  Body Aches  -Acetaminophen (Tyelnol) -Ibuprofen (Advil, motrin,  aleve)  Headache  -Acetaminophen (Tyelnol) -Ibuprofen (Advil, motrin, aleve) - Exedrin, Exedrin Migraine  Allergy symptoms (cough, sneeze, runny nose, itchy eyes) -Claritin or loratadine cheapest but likely the weakest  -Zyrtec or certizine at night because it can make you sleepy -The strongest is allegra or fexafinadine  Cheapest at [facility], [employer], [employer]  Cough  -Dextromethorphan (Delsym)- medicine that has DM in it -Guafenesin (Mucinex/Robitussin) - cough drops - drink lots of water  Chest Congestion  -Guafenesin (Mucinex/Robitussin)  Red Itchy Eyes  - Naphcon-A  Upset Stomach  - Bland diet (nothing spicy, greasy, fried, and high acid foods like tomatoes, oranges, berries) -OKAY- cereal, bread, soup, crackers, rice -Eat smaller more frequent meals -reduce caffeine, no alcohol -Loperamide (Imodium-AD) if diarrhea -Prevacid for heart burn  General health when sick  -Hydration -wash your hands frequently -keep surfaces clean -change pillow cases and sheets often -Get fresh air but do not exercise strenuously -Vitamin D, double up on it - Vitamin C -Zinc

## 2016-08-13 ENCOUNTER — Other Ambulatory Visit: Payer: Self-pay | Admitting: Physician Assistant

## 2016-08-13 DIAGNOSIS — F988 Other specified behavioral and emotional disorders with onset usually occurring in childhood and adolescence: Secondary | ICD-10-CM

## 2016-08-13 MED ORDER — AMPHETAMINE-DEXTROAMPHETAMINE 20 MG PO TABS: ORAL_TABLET | ORAL | 0 refills | Status: DC

## 2016-09-17 ENCOUNTER — Other Ambulatory Visit: Payer: Self-pay | Admitting: Physician Assistant

## 2016-09-17 DIAGNOSIS — F988 Other specified behavioral and emotional disorders with onset usually occurring in childhood and adolescence: Secondary | ICD-10-CM

## 2016-09-17 MED ORDER — AMPHETAMINE-DEXTROAMPHETAMINE 20 MG PO TABS: ORAL_TABLET | ORAL | 0 refills | Status: DC

## 2016-10-24 ENCOUNTER — Telehealth: Payer: Self-pay | Admitting: *Deleted

## 2016-10-24 ENCOUNTER — Other Ambulatory Visit: Payer: Self-pay | Admitting: *Deleted

## 2016-10-24 DIAGNOSIS — F988 Other specified behavioral and emotional disorders with onset usually occurring in childhood and adolescence: Secondary | ICD-10-CM

## 2016-10-24 MED ORDER — AMPHETAMINE-DEXTROAMPHETAMINE 20 MG PO TABS: ORAL_TABLET | ORAL | 0 refills | Status: DC

## 2016-10-24 NOTE — Telephone Encounter (Signed)
Adderall  Refill was mailed to the patient at request.

## 2016-11-19 ENCOUNTER — Other Ambulatory Visit: Payer: Self-pay | Admitting: Physician Assistant

## 2016-11-19 DIAGNOSIS — F988 Other specified behavioral and emotional disorders with onset usually occurring in childhood and adolescence: Secondary | ICD-10-CM

## 2016-11-19 MED ORDER — AMPHETAMINE-DEXTROAMPHETAMINE 20 MG PO TABS: ORAL_TABLET | ORAL | 0 refills | Status: DC

## 2016-12-25 ENCOUNTER — Other Ambulatory Visit: Payer: Self-pay | Admitting: Physician Assistant

## 2016-12-25 DIAGNOSIS — F988 Other specified behavioral and emotional disorders with onset usually occurring in childhood and adolescence: Secondary | ICD-10-CM

## 2016-12-25 MED ORDER — AMPHETAMINE-DEXTROAMPHETAMINE 20 MG PO TABS: ORAL_TABLET | ORAL | 0 refills | Status: DC

## 2017-01-25 ENCOUNTER — Telehealth: Payer: Self-pay | Admitting: Physician Assistant

## 2017-01-25 DIAGNOSIS — F988 Other specified behavioral and emotional disorders with onset usually occurring in childhood and adolescence: Secondary | ICD-10-CM

## 2017-01-25 MED ORDER — AMPHETAMINE-DEXTROAMPHETAMINE 20 MG PO TABS: ORAL_TABLET | ORAL | 0 refills | Status: DC

## 2017-01-25 NOTE — Telephone Encounter (Signed)
Will follow up 

## 2017-01-30 NOTE — Progress Notes (Deleted)
Follow up  Assessment and Plan: Asthma due to seasonal allergies - montelukast (SINGULAIR) 10 MG tablet; Take 1 tablet (10 mg total) by mouth daily.  Dispense: 30 tablet; Refill: 2 - albuterol (PROVENTIL HFA;VENTOLIN HFA) 108 (90 Base) MCG/ACT inhaler; Inhale 2 puffs into the lungs every 6 (six) hours as needed for wheezing or shortness of breath.  Dispense: 1 Inhaler; Refill: 2   Attention deficit disorder, unspecified hyperactivity presence - amphetamine-dextroamphetamine (ADDERALL) 20 MG tablet; Take 1/2 to 1 tablet 2 x day if needed for ADD, Need to not take daily, RX should last longer than 1 month  Dispense: 60 tablet; Refill: 0   Acute bronchitis, unspecified organism Allergy pill, albuterol inhaler - azithromycin (ZITHROMAX) 250 MG tablet; Take 2 tablets (500 mg) on  Day 1,  followed by 1 tablet (250 mg) once daily on Days 2 through 5.  Dispense: 6 each; Refill: 1  Schedule CPE Discussed med's effects and SE's. Screening labs and tests as requested with regular follow-up as recommended.   HPI  This very nice 31 y.o.female presents for follow up. 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  Patient has no major health issues.  Patient reports no complaints at this time.  Has allergies and asthma, tried mucinex, dayquil. Has had cough x 2-3 months.  She does workout.  Finally, patient has history of Vitamin D Deficiency and last vitamin D.  Currently on supplementation.  She is on adderall for ADD.   Current Medications:  Current Outpatient Prescriptions on File Prior to Visit  Medication Sig Dispense Refill  . albuterol (PROVENTIL HFA;VENTOLIN HFA) 108 (90 Base) MCG/ACT inhaler Inhale 2 puffs into the lungs every 6 (six) hours as needed for wheezing or shortness of breath. 1 Inhaler 2  . amphetamine-dextroamphetamine (ADDERALL) 20 MG tablet Take 1/2 to 1 tablet 2 x day if needed for ADD, Need to not take daily, RX  should last longer than 1 month 60 tablet 0  . benzonatate (TESSALON PERLES) 100 MG capsule Take 2 capsules (200 mg total) by mouth 3 (three) times daily as needed for cough (Max:  per day). 60 capsule 0  . drospirenone-ethinyl estradiol (YAZ,GIANVI,LORYNA) 3-0.02 MG tablet Take 1 tablet by mouth daily. 1 Package 11  . montelukast (SINGULAIR) 10 MG tablet Take 1 tablet (10 mg total) by mouth daily. 30 tablet 2   No current facility-administered medications on file prior to visit.    Review of Systems: Review of Systems  Constitutional: Negative.  Negative for chills and fever.  HENT: Negative.   Eyes: Negative.   Respiratory: Positive for cough. Negative for hemoptysis, sputum production, shortness of breath and wheezing.   Cardiovascular: Negative.   Gastrointestinal: Negative.   Genitourinary: Negative.   Musculoskeletal: Negative.   Skin: Negative.   Neurological: Negative.   Psychiatric/Behavioral: Negative.     Physical Exam: Estimated body mass index is 26.06 kg/m as calculated from the following:   Height as of 07/12/16: 5' 5.5" (1.664 m).   Weight as of 07/12/16: 159 lb (72.1 kg). There were no vitals taken for this visit. General Appearance: Well nourished, in no apparent distress.  Eyes: PERRLA, EOMs, conjunctiva no swelling or erythema, normal fundi and vessels.  Sinuses: No Frontal/maxillary tenderness  ENT/Mouth: Ext aud canals clear, normal light reflex with TMs without erythema, bulging. Good dentition. No erythema, swelling, or exudate on post pharynx. Tonsils not swollen or erythematous. Hearing normal.  Neck: Supple, thyroid normal. No bruits  Respiratory: Respiratory effort normal, BS equal bilaterally without rales, rhonchi, wheezing or stridor.  Cardio: RRR without murmurs, rubs or gallops. Brisk peripheral pulses without edema.  Chest: symmetric, with normal excursions and percussion.  Breasts: will get next week.  Abdomen: Soft, nontender, no guarding,  rebound, hernias, masses, or organomegaly.  Lymphatics: Non tender without lymphadenopathy.  Genitourinary: will get next week Musculoskeletal: Full ROM all peripheral extremities,5/5 strength, and normal gait.  Skin: Warm, dry without rashes, lesions, ecchymosis. Neuro: Cranial nerves intact, reflexes equal bilaterally. Normal muscle tone, no cerebellar symptoms. Sensation intact.  Psych: Awake and oriented X 3, normal affect, Insight and Judgment appropriate.    Quentin Mulling 8:33 AM [facility]

## 2017-01-31 ENCOUNTER — Ambulatory Visit: Payer: Self-pay | Admitting: Physician Assistant

## 2017-02-26 NOTE — Progress Notes (Deleted)
FOLLOW UP  Assessment and Plan:   ADD  Continue medications: adderall 1/2-1 tab BID if needed  Helps with focus, no AE's.  The patient was counseled on the addictive nature of the medication and was encouraged to take drug holidays when not needed.   Asthma due to seasonal allergies  Continue medications: singulair 10 mg daily, albuterol inhaler PRN  Vitamin D Def  Has not been supplementing; recommended she start 2000 IU daily  Self-pay; will postpone vitamin D level check  Continue diet and meds as discussed. Further disposition pending results of labs. Discussed med's effects and SE's.   Over 30 minutes of exam, counseling, chart review, and critical decision making was performed.   Future Appointments Date Time Provider Department Center  02/28/2017 11:00 AM Judd Gaudier, NP [facility] None  07/16/2017 2:00 PM Quentin Mulling, PA-C [facility] None    ----------------------------------------------------------------------------------------------------------------------  HPI 31 y.o. female  presents for 6 month follow up on ADD, asthma related to seasonal allergies, and vitamin D deficiency.   She is currently on adderall 20 mg 1/2-1 tab BID if needed, with expected drug vacations Patient is on an ADD medication, she states that the medication is helping and she denies any adverse reactions.   Her asthma has been stable on singulair 10 mg daily, with albuterol inhaler PRN.    Current Medications:  Current Outpatient Prescriptions on File Prior to Visit  Medication Sig  . albuterol (PROVENTIL HFA;VENTOLIN HFA) 108 (90 Base) MCG/ACT inhaler Inhale 2 puffs into the lungs every 6 (six) hours as needed for wheezing or shortness of breath.  . amphetamine-dextroamphetamine (ADDERALL) 20 MG tablet Take 1/2 to 1 tablet 2 x day if needed for ADD, Need to not take daily, RX should last longer than 1 month  . benzonatate (TESSALON PERLES) 100 MG capsule Take 2 capsules (200  mg total) by mouth 3 (three) times daily as needed for cough (Max:  per day).  . drospirenone-ethinyl estradiol (YAZ,GIANVI,LORYNA) 3-0.02 MG tablet Take 1 tablet by mouth daily.  . montelukast (SINGULAIR) 10 MG tablet Take 1 tablet (10 mg total) by mouth daily.   No current facility-administered medications on file prior to visit.      Allergies: No Known Allergies   Medical History:  Past Medical History:  Diagnosis Date  . ADD (attention deficit disorder)   . ADHD (attention deficit hyperactivity disorder)   . Asthma   . Asthma due to seasonal allergies 07/29/2013   Family history- Reviewed and unchanged Social history- Reviewed and unchanged   Review of Systems:  ROS    Physical Exam: There were no vitals taken for this visit. Wt Readings from Last 3 Encounters:  07/12/16 159 lb (72.1 kg)  08/24/15 140 lb 6.4 oz (63.7 kg)  01/03/15 152 lb 12.8 oz (69.3 kg)   General Appearance: Well nourished, in no apparent distress. Eyes: PERRLA, EOMs, conjunctiva no swelling or erythema Sinuses: No Frontal/maxillary tenderness ENT/Mouth: Ext aud canals clear, TMs without erythema, bulging. No erythema, swelling, or exudate on post pharynx.  Tonsils not swollen or erythematous. Hearing normal.  Neck: Supple, thyroid normal.  Respiratory: Respiratory effort normal, BS equal bilaterally without rales, rhonchi, wheezing or stridor.  Cardio: RRR with no MRGs. Brisk peripheral pulses without edema.  Abdomen: Soft, + BS.  Non tender, no guarding, rebound, hernias, masses. Lymphatics: Non tender without lymphadenopathy.  Musculoskeletal: Full ROM, 5/5 strength, {PSY - GAIT AND STATION:22860} gait Skin: Warm, dry without rashes, lesions, ecchymosis.  Neuro: Cranial nerves  intact. No cerebellar symptoms.  Psych: Awake and oriented X 3, normal affect, Insight and Judgment appropriate.    Dan Maker, NP 9:06 PM [facility]

## 2017-02-27 ENCOUNTER — Other Ambulatory Visit: Payer: Self-pay | Admitting: Physician Assistant

## 2017-02-27 DIAGNOSIS — F988 Other specified behavioral and emotional disorders with onset usually occurring in childhood and adolescence: Secondary | ICD-10-CM

## 2017-02-27 MED ORDER — AMPHETAMINE-DEXTROAMPHETAMINE 20 MG PO TABS: ORAL_TABLET | ORAL | 0 refills | Status: DC

## 2017-02-27 NOTE — Progress Notes (Signed)
Future Appointments Date Time Provider Department Center  02/28/2017 11:00 AM Judd Gaudierorbett, Ashley, NP [facility] None  07/16/2017 2:00 PM Quentin Mullingollier, Sharnae Winfree, PA-C [facility] None

## 2017-02-28 ENCOUNTER — Ambulatory Visit: Payer: Self-pay | Admitting: Adult Health

## 2017-03-05 NOTE — Progress Notes (Deleted)
Assessment and Plan:  Attention deficit disorder, unspecified hyperactivity presence Continue medications Helps with focus, no AE's. The patient was counseled on the addictive nature of the medication and was encouraged to take drug holidays when not needed.   Asthma due to seasonal allergies Well controlled with current regimen Continue singulair, albuterol as needed  Further disposition pending results of labs. Discussed med's effects and SE's.   Over 30 minutes of exam, counseling, chart review, and critical decision making was performed.   Future Appointments Date Time Provider Department Center  03/06/2017 2:30 PM Judd Gaudierorbett, Satchel Heidinger, NP [facility] None  07/16/2017 2:00 PM Quentin Mullingollier, Amanda, PA-C [facility] None    ------------------------------------------------------------------------------------------------------------------   HPI 31 y.o.female presents for follow up for ADD and asthma related to seasonal allergies for which she was started on singulair and albuterol at the last visit.   Patient is on an ADD medication, she states that the medication is helping and she denies any adverse reactions.    Past Medical History:  Diagnosis Date  . ADD (attention deficit disorder)   . ADHD (attention deficit hyperactivity disorder)   . Asthma   . Asthma due to seasonal allergies 07/29/2013     No Known Allergies  Current Outpatient Prescriptions on File Prior to Visit  Medication Sig  . albuterol (PROVENTIL HFA;VENTOLIN HFA) 108 (90 Base) MCG/ACT inhaler Inhale 2 puffs into the lungs every 6 (six) hours as needed for wheezing or shortness of breath.  . amphetamine-dextroamphetamine (ADDERALL) 20 MG tablet Take 1/2 to 1 tablet 2 x day if needed for ADD, Need to not take daily, RX should last longer than 1 month  . benzonatate (TESSALON PERLES) 100 MG capsule Take 2 capsules (200 mg total) by mouth 3 (three) times daily as needed for cough (Max: 600mg  per day).  .  drospirenone-ethinyl estradiol (YAZ,GIANVI,LORYNA) 3-0.02 MG tablet Take 1 tablet by mouth daily.  . montelukast (SINGULAIR) 10 MG tablet Take 1 tablet (10 mg total) by mouth daily.   No current facility-administered medications on file prior to visit.     ROS: all negative except above.   Physical Exam:  There were no vitals taken for this visit.  General Appearance: Well nourished, in no apparent distress. Eyes: PERRLA, EOMs, conjunctiva no swelling or erythema Sinuses: No Frontal/maxillary tenderness ENT/Mouth: Ext aud canals clear, TMs without erythema, bulging. No erythema, swelling, or exudate on post pharynx.  Tonsils not swollen or erythematous. Hearing normal.  Neck: Supple, thyroid normal.  Respiratory: Respiratory effort normal, BS equal bilaterally without rales, rhonchi, wheezing or stridor.  Cardio: RRR with no MRGs. Brisk peripheral pulses without edema.  Abdomen: Soft, + BS.  Non tender, no guarding, rebound, hernias, masses. Lymphatics: Non tender without lymphadenopathy.  Musculoskeletal: Full ROM, 5/5 strength, normal gait.  Skin: Warm, dry without rashes, lesions, ecchymosis.  Neuro: Cranial nerves intact. Normal muscle tone, no cerebellar symptoms. Sensation intact.  Psych: Awake and oriented X 3, normal affect, Insight and Judgment appropriate.     Dan MakerAshley C Juana Montini, NP 7:58 PM [facility]

## 2017-03-06 ENCOUNTER — Ambulatory Visit: Payer: Self-pay | Admitting: Adult Health

## 2017-04-10 ENCOUNTER — Encounter: Payer: Self-pay | Admitting: Physician Assistant

## 2017-05-08 ENCOUNTER — Encounter: Payer: Self-pay | Admitting: Physician Assistant

## 2017-07-16 ENCOUNTER — Encounter: Payer: Self-pay | Admitting: Physician Assistant

## 2018-04-19 ENCOUNTER — Emergency Department (HOSPITAL_BASED_OUTPATIENT_CLINIC_OR_DEPARTMENT_OTHER)
Admission: EM | Admit: 2018-04-19 | Discharge: 2018-04-20 | Disposition: A | Discharge status: Home / Self Care | Payer: No Typology Code available for payment source | Attending: Emergency Medicine | Admitting: Emergency Medicine

## 2018-04-19 ENCOUNTER — Other Ambulatory Visit: Payer: Self-pay

## 2018-04-19 ENCOUNTER — Encounter (HOSPITAL_BASED_OUTPATIENT_CLINIC_OR_DEPARTMENT_OTHER): Payer: Self-pay | Admitting: *Deleted

## 2018-04-19 DIAGNOSIS — J45909 Unspecified asthma, uncomplicated: Secondary | ICD-10-CM | POA: Insufficient documentation

## 2018-04-19 DIAGNOSIS — Z79899 Other long term (current) drug therapy: Secondary | ICD-10-CM | POA: Insufficient documentation

## 2018-04-19 DIAGNOSIS — H1033 Unspecified acute conjunctivitis, bilateral: Secondary | ICD-10-CM | POA: Insufficient documentation

## 2018-04-19 DIAGNOSIS — F909 Attention-deficit hyperactivity disorder, unspecified type: Secondary | ICD-10-CM | POA: Insufficient documentation

## 2018-04-19 MED ORDER — FLUORESCEIN SODIUM 1 MG OP STRP
1.0000 | ORAL_STRIP | Freq: Once | OPHTHALMIC | Status: AC
  Administered 2018-04-19: 1 via OPHTHALMIC
  Filled 2018-04-19: qty 1

## 2018-04-19 MED ORDER — IBUPROFEN 800 MG PO TABS
800.0000 mg | ORAL_TABLET | Freq: Once | ORAL | Status: AC
  Administered 2018-04-19: 800 mg via ORAL
  Filled 2018-04-19: qty 1

## 2018-04-19 MED ORDER — TETRACAINE HCL 0.5 % OP SOLN
2.0000 [drp] | Freq: Once | OPHTHALMIC | Status: AC
  Administered 2018-04-19: 2 [drp] via OPHTHALMIC
  Filled 2018-04-19: qty 4

## 2018-04-19 NOTE — ED Triage Notes (Addendum)
Since Thursday pt has had bilateral eye redness and pain, left side facial pain, and pain in her temples. She saw her PCP and was started on topical antibiotics and amoxicillin. States her tears were bloody today. She is not wearing her contacts and doesn't have glasses with her. Pt states she used a new eye solution prior to Sx starting

## 2018-04-19 NOTE — ED Provider Notes (Signed)
[facility] EMERGENCY DEPARTMENT Provider Note   CSN: [id number] Arrival date & time: 04/19/18  2112     History   Chief Complaint Chief Complaint  Patient presents with  . Eye Problem    HPI Pamela Reilly is a 32 y.o. female.  HPI Patient reports that approximately 2 weeks ago she started wearing contact lenses again.  He had not been wearing them for about 4 more months.  She had been seen by an optometrist and given 2-week contact lens.  She reports that she was told after 1 week she needed to clean it with clear Care Plus but make sure to rinse the contacts and follow the directions exactly.  Patient reports that at 1 week she did use the clear Care Plus but realizes now that instead of rinsing the contacts with water she rinsed them with her own contact solution.  Ports other than that she follow the directions exactly.  Was on Wednesday.  On Thursday evening she started getting some redness particularly in the right side than the left side.  The next morning she woke up and her eyes were matted and she has some pain and swelling on the left side of her face.  Friday she saw her primary care doctor for the symptoms and was put on amoxicillin and erythromycin ophthalmic ointment.  Patient reports that she seemed to be getting worse today.  Her eyes are more red and painful.  They are very sensitive to light.  Having a lot of tearing. Past Medical History:  Diagnosis Date  . ADD (attention deficit disorder)   . ADHD (attention deficit hyperactivity disorder)   . Asthma   . Asthma due to seasonal allergies 07/29/2013    Patient Active Problem List   Diagnosis Date Noted  . Medication management 01/03/2015  . Vitamin D deficiency 01/03/2015  . Asthma due to seasonal allergies 07/29/2013  . ADD (attention deficit disorder) 03/31/2013    Past Surgical History:  Procedure Laterality Date  . APPENDECTOMY    . TONSILLECTOMY  2015     OB History   None      Home  Medications    Prior to Admission medications   Medication Sig Start Date End Date Taking? Authorizing Provider  albuterol (PROVENTIL HFA;VENTOLIN HFA) 108 (90 Base) MCG/ACT inhaler Inhale 2 puffs into the lungs every 6 (six) hours as needed for wheezing or shortness of breath. 07/12/16   Quentin Mullingollier, Amanda, PA-C  amphetamine-dextroamphetamine (ADDERALL) 20 MG tablet Take 1/2 to 1 tablet 2 x day if needed for ADD, Need to not take daily, RX should last longer than 1 month 02/27/17   Quentin Mullingollier, Amanda, PA-C  benzonatate (TESSALON PERLES) 100 MG capsule Take 2 capsules (200 mg total) by mouth 3 (three) times daily as needed for cough (Max: 600mg  per day). 07/12/16   Quentin Mullingollier, Amanda, PA-C  drospirenone-ethinyl estradiol (YAZ,GIANVI,LORYNA) 3-0.02 MG tablet Take 1 tablet by mouth daily. 09/20/14   Shirleen Schirmerllis, Courtney, PA-C  HYDROcodone-acetaminophen (NORCO/VICODIN) 5-325 MG tablet Take 1-2 tablets by mouth every 6 (six) hours as needed. 04/20/18   Arby BarrettePfeiffer, Chace Bisch, MD  ibuprofen (ADVIL,MOTRIN) 800 MG tablet Take 1 tablet (800 mg total) by mouth 3 (three) times daily. 04/20/18   Arby BarrettePfeiffer, Elody Kleinsasser, MD  montelukast (SINGULAIR) 10 MG tablet Take 1 tablet (10 mg total) by mouth daily. 07/12/16 07/12/17  Quentin Mullingollier, Amanda, PA-C    Family History Family History  Problem Relation Age of Onset  . Diabetes Mother  Social History Social History   Tobacco Use  . Smoking status: Never Smoker  . Smokeless tobacco: Never Used  Substance Use Topics  . Alcohol use: Yes    Comment: social  . Drug use: No     Allergies   Patient has no known allergies.   Review of Systems Review of Systems 10 Systems reviewed and are negative for acute change except as noted in the HPI.   Physical Exam Updated Vital Signs BP (!) 142/80 (BP Location: Left Arm)   Pulse (!) 103   Temp 98.8 F (37.1 C) (Oral)   Resp 17   Ht 5\' 6"  (1.676 m)   Wt 77.1 kg   LMP 03/27/2018   SpO2 100%   BMI 27.44 kg/m   Physical Exam    Constitutional: She is oriented to person, place, and time. She appears well-developed and well-nourished.  HENT:  Head: Normocephalic and atraumatic.  Eyes:  Bilateral sclera are injected.  Patient has copious clear tearing of the eyes.  Mild symmetric lid edema and erythema.  Patient's pupils are briskly reactive to light.  Fundus shows crisp optic disks.  External ocular motions normal.  Fluorescein stain does not show any uptake on the cornea.  Slit lamp examination does not show any significant surface irregularities to the eye except for the extensive injection bilateral sclera.  Mild concentration around the iris.  Anterior chamber appears clear.  Pulmonary/Chest: Effort normal.  Neurological: She is alert and oriented to person, place, and time. She exhibits normal muscle tone. Coordination normal.  Skin: Skin is warm and dry.  Psychiatric: She has a normal mood and affect.     ED Treatments / Results  Labs (all labs ordered are listed, but only abnormal results are displayed) Labs Reviewed - No data to display  EKG None  Radiology No results found.  Procedures Procedures (including critical care time)  Medications Ordered in ED Medications  HYDROcodone-acetaminophen (NORCO/VICODIN) 5-325 MG per tablet 2 tablet (has no administration in time range)  ibuprofen (ADVIL,MOTRIN) tablet 800 mg (800 mg Oral Given 04/19/18 2309)  fluorescein ophthalmic strip 1 strip (1 strip Both Eyes Given 04/19/18 2309)  tetracaine (PONTOCAINE) 0.5 % ophthalmic solution 2 drop (2 drops Both Eyes Given 04/19/18 2309)     Initial Impression / Assessment and Plan / ED Course  I have reviewed the triage vital signs and the nursing notes.  Pertinent labs & imaging results that were available during my care of the patient were reviewed by me and considered in my medical decision making (see chart for details).   Consult: Reviewed with Dr. Alonna Buckler.  Advises most consistent with a chemical  conjunctivitis.  Patient can continue erythromycin ophthalmic as prescribed.Patient can f/u on Monday.     Final Clinical Impressions(s) / ED Diagnoses   Final diagnoses:  Acute conjunctivitis of both eyes, unspecified acute conjunctivitis type    ED Discharge Orders         Ordered    HYDROcodone-acetaminophen (NORCO/VICODIN) 5-325 MG tablet  Every 6 hours PRN     04/20/18 0029    ibuprofen (ADVIL,MOTRIN) 800 MG tablet  3 times daily     04/20/18 0029           Arby Barrette, MD 04/20/18 [phone]

## 2018-04-19 NOTE — ED Notes (Signed)
Patient does not have glasses with her and her contacts are not in. Patient can not see distance just close up. The patient states she can only see the [place] on the chart.

## 2018-04-19 NOTE — ED Notes (Signed)
Pt does not have her glasses or contacts

## 2018-04-19 NOTE — ED Notes (Addendum)
Pt experiencing right eye irritation x 3 days with clear drainage. She states that on Friday she started having drainage from left eye with redness/irritation in addition to right eye, and also experiencing headache in left and right temples, left side face and jaw pain that radiates to neck. Mild swelling noted to left cheek area. Pt does not wear glasses, only contacts. P states she took 400 mg of ibuprofen at 1500 today with no relief.

## 2018-04-20 MED ORDER — HYDROCODONE-ACETAMINOPHEN 5-325 MG PO TABS: 1.0000 | ORAL_TABLET | Freq: Four times a day (QID) | ORAL | 0 refills | Status: DC | PRN

## 2018-04-20 MED ORDER — IBUPROFEN 800 MG PO TABS: 800.0000 mg | ORAL_TABLET | Freq: Three times a day (TID) | ORAL | 0 refills | Status: DC

## 2018-04-20 MED ORDER — HYDROCODONE-ACETAMINOPHEN 5-325 MG PO TABS
2.0000 | ORAL_TABLET | Freq: Once | ORAL | Status: AC
  Administered 2018-04-20: 2 via ORAL
  Filled 2018-04-20: qty 2

## 2018-04-20 NOTE — ED Notes (Signed)
Pt and family understood dc material. NAD noted. Script given at dc 

## 2018-04-20 NOTE — Discharge Instructions (Addendum)
1.  Continue the erythromycin ophthalmic ointment as prescribed. 2.  Take ibuprofen 800 mg 3 times a day for pain control.  You may take 1-2 Vicodin every 6 hours as needed for additional pain control. 3.  Follow-up with [facility] on Monday.  Your case was reviewed with Dr. Alonna Bucklerhris Shaw.

## 2019-06-01 ENCOUNTER — Other Ambulatory Visit: Payer: Self-pay

## 2019-06-01 ENCOUNTER — Inpatient Hospital Stay (HOSPITAL_COMMUNITY): Payer: Self-pay

## 2019-06-01 ENCOUNTER — Inpatient Hospital Stay (HOSPITAL_COMMUNITY)
Admission: AD | Admit: 2019-06-01 | Discharge: 2019-06-01 | Disposition: A | Discharge status: Home / Self Care | Payer: Self-pay | Attending: Obstetrics and Gynecology | Admitting: Obstetrics and Gynecology

## 2019-06-01 ENCOUNTER — Encounter (HOSPITAL_COMMUNITY): Payer: Self-pay | Admitting: Obstetrics and Gynecology

## 2019-06-01 DIAGNOSIS — Z79899 Other long term (current) drug therapy: Secondary | ICD-10-CM | POA: Insufficient documentation

## 2019-06-01 DIAGNOSIS — O209 Hemorrhage in early pregnancy, unspecified: Secondary | ICD-10-CM | POA: Insufficient documentation

## 2019-06-01 DIAGNOSIS — O00201 Right ovarian pregnancy without intrauterine pregnancy: Secondary | ICD-10-CM

## 2019-06-01 DIAGNOSIS — F909 Attention-deficit hyperactivity disorder, unspecified type: Secondary | ICD-10-CM | POA: Insufficient documentation

## 2019-06-01 DIAGNOSIS — O3680X0 Pregnancy with inconclusive fetal viability, not applicable or unspecified: Secondary | ICD-10-CM

## 2019-06-01 DIAGNOSIS — O99511 Diseases of the respiratory system complicating pregnancy, first trimester: Secondary | ICD-10-CM | POA: Insufficient documentation

## 2019-06-01 DIAGNOSIS — O99891 Other specified diseases and conditions complicating pregnancy: Secondary | ICD-10-CM | POA: Insufficient documentation

## 2019-06-01 DIAGNOSIS — O99341 Other mental disorders complicating pregnancy, first trimester: Secondary | ICD-10-CM | POA: Insufficient documentation

## 2019-06-01 DIAGNOSIS — J45909 Unspecified asthma, uncomplicated: Secondary | ICD-10-CM | POA: Insufficient documentation

## 2019-06-01 DIAGNOSIS — Z833 Family history of diabetes mellitus: Secondary | ICD-10-CM | POA: Insufficient documentation

## 2019-06-01 DIAGNOSIS — Z791 Long term (current) use of non-steroidal anti-inflammatories (NSAID): Secondary | ICD-10-CM | POA: Insufficient documentation

## 2019-06-01 DIAGNOSIS — R109 Unspecified abdominal pain: Secondary | ICD-10-CM | POA: Insufficient documentation

## 2019-06-01 LAB — URINALYSIS, ROUTINE W REFLEX MICROSCOPIC
Bilirubin Urine: NEGATIVE
Glucose, UA: NEGATIVE mg/dL
Ketones, ur: 5 mg/dL — AB
Leukocytes,Ua: NEGATIVE
Nitrite: NEGATIVE
Protein, ur: NEGATIVE mg/dL
Specific Gravity, Urine: 1.015 (ref 1.005–1.030)
pH: 5 (ref 5.0–8.0)

## 2019-06-01 LAB — CBC
HCT: 42.4 % (ref 36.0–46.0)
Hemoglobin: 14.5 g/dL (ref 12.0–15.0)
MCH: 30.5 pg (ref 26.0–34.0)
MCHC: 34.2 g/dL (ref 30.0–36.0)
MCV: 89.3 fL (ref 80.0–100.0)
Platelets: 242 10*3/uL (ref 150–400)
RBC: 4.75 MIL/uL (ref 3.87–5.11)
RDW: 13 % (ref 11.5–15.5)
WBC: 8 10*3/uL (ref 4.0–10.5)
nRBC: 0 % (ref 0.0–0.2)

## 2019-06-01 LAB — HCG, QUANTITATIVE, PREGNANCY: hCG, Beta Chain, Quant, S: 434 m[IU]/mL — ABNORMAL HIGH (ref <5)

## 2019-06-01 LAB — WET PREP, GENITAL
Clue Cells Wet Prep HPF POC: NONE SEEN
Sperm: NONE SEEN
Trich, Wet Prep: NONE SEEN
WBC, Wet Prep HPF POC: NONE SEEN
Yeast Wet Prep HPF POC: NONE SEEN

## 2019-06-01 LAB — POCT PREGNANCY, URINE: Preg Test, Ur: POSITIVE — AB

## 2019-06-01 LAB — ABO/RH: ABO/RH(D): A POS

## 2019-06-01 LAB — HIV ANTIBODY (ROUTINE TESTING W REFLEX): HIV Screen 4th Generation wRfx: NONREACTIVE

## 2019-06-01 NOTE — MAU Provider Note (Addendum)
History     CSN: [id number]  Arrival date and time: 06/01/19 1523   First Provider Initiated Contact with Patient 06/01/19 1615      Chief Complaint  Patient presents with   Abdominal Pain   Vaginal Bleeding   HPI  Patient is a 34 yo G1P0 presenting with complaint of vaginal bleeding. She reports being on "low dose hormonal pills" for contraception but admits that she is not consistent with taking them. She took a pregnancy test on Tuesday, which was positive.She has never been pregnant before. States that she started spotting approx. 2 weeks ago but started experiencing significant bleeding the past few days. The amount of blood is "more than a period." She admits to chills, nausea, cramping, and abdominal pain rated 7/10. Denies any history of known hypertension, diabetes, other medical diagnoses.   OB History     Gravida  1   Para      Term      Preterm      AB      Living         SAB      TAB      Ectopic      Multiple      Live Births              Past Medical History:  Diagnosis Date   ADD (attention deficit disorder)    ADHD (attention deficit hyperactivity disorder)    Asthma    Asthma due to seasonal allergies 07/29/2013    Past Surgical History:  Procedure Laterality Date   APPENDECTOMY     BREAST REDUCTION SURGERY     TONSILLECTOMY  2015    Family History  Problem Relation Age of Onset   Diabetes Mother     Social History   Tobacco Use   Smoking status: Never Smoker   Smokeless tobacco: Never Used  Substance Use Topics   Alcohol use: Not Currently    Comment: social   Drug use: Not Currently    Allergies: No Known Allergies  Medications Prior to Admission  Medication Sig Dispense Refill Last Dose   albuterol (PROVENTIL HFA;VENTOLIN HFA) 108 (90 Base) MCG/ACT inhaler Inhale 2 puffs into the lungs every 6 (six) hours as needed for wheezing or shortness of breath. 1 Inhaler 2    amphetamine-dextroamphetamine (ADDERALL) 20 MG  tablet Take 1/2 to 1 tablet 2 x day if needed for ADD, Need to not take daily, RX should last longer than 1 month 60 tablet 0    benzonatate (TESSALON PERLES) 100 MG capsule Take 2 capsules (200 mg total) by mouth 3 (three) times daily as needed for cough (Max: 600mg  per day). 60 capsule 0    drospirenone-ethinyl estradiol (YAZ,GIANVI,LORYNA) 3-0.02 MG tablet Take 1 tablet by mouth daily. 1 Package 11    HYDROcodone-acetaminophen (NORCO/VICODIN) 5-325 MG tablet Take 1-2 tablets by mouth every 6 (six) hours as needed. 8 tablet 0    ibuprofen (ADVIL,MOTRIN) 800 MG tablet Take 1 tablet (800 mg total) by mouth 3 (three) times daily. 21 tablet 0    montelukast (SINGULAIR) 10 MG tablet Take 1 tablet (10 mg total) by mouth daily. 30 tablet 2     Review of Systems  Constitutional: Positive for chills.  Eyes:       Negative for blurry vision.   Gastrointestinal: Positive for nausea. Negative for constipation, diarrhea and vomiting.  Genitourinary: Positive for vaginal bleeding (see HPI. ). Negative for dysuria.  Neurological: Negative for light-headedness  and headaches.    See HPI.  Physical Exam   Blood pressure 139/84, pulse 97, temperature 98.4 F (36.9 C), temperature source Oral, resp. rate 16, height 5\' 6"  (1.676 m), weight 73.9 kg, SpO2 100 %.  Physical Exam  Constitutional: She appears well-developed and well-nourished.  HENT:  Head: Normocephalic and atraumatic.  Eyes: Conjunctivae and EOM are normal.  Cardiovascular: Normal rate and regular rhythm.  Respiratory: Effort normal. No respiratory distress.  Genitourinary:    Genitourinary Comments: Moderate amount of blood within vaginal vault and cervical os. No discharge noted. Nulliparous cervix without lesions. Gc/ct and wet prep swabs collected.  Negative CMT. Pain and discomfort noted with bimanual exam. No masses palpated.    Musculoskeletal:        General: Normal range of motion.     Cervical back: Normal range of motion.   Neurological: She is alert.  Skin: Skin is warm and dry.  Psychiatric:  Slightly nervous and anxious due to nature of her visit.      MAU Course  Procedures  .Marland Kitchen Results for orders placed or performed during the hospital encounter of 06/01/19 (from the past 24 hour(s))  Pregnancy, urine POC     Status: Abnormal   Collection Time: 06/01/19  3:36 PM  Result Value Ref Range   Preg Test, Ur POSITIVE (A) NEGATIVE  Urinalysis, Routine w reflex microscopic     Status: Abnormal   Collection Time: 06/01/19  3:39 PM  Result Value Ref Range   Color, Urine YELLOW YELLOW   APPearance CLEAR CLEAR   Specific Gravity, Urine 1.015 1.005 - 1.030   pH 5.0 5.0 - 8.0   Glucose, UA NEGATIVE NEGATIVE mg/dL   Hgb urine dipstick MODERATE (A) NEGATIVE   Bilirubin Urine NEGATIVE NEGATIVE   Ketones, ur 5 (A) NEGATIVE mg/dL   Protein, ur NEGATIVE NEGATIVE mg/dL   Nitrite NEGATIVE NEGATIVE   Leukocytes,Ua NEGATIVE NEGATIVE   RBC / HPF 21-50 0 - 5 RBC/hpf   WBC, UA 0-5 0 - 5 WBC/hpf   Bacteria, UA RARE (A) NONE SEEN   Squamous Epithelial / LPF 0-5 0 - 5   Mucus PRESENT   Wet prep, genital     Status: None   Collection Time: 06/01/19  4:34 PM  Result Value Ref Range   Yeast Wet Prep HPF POC NONE SEEN NONE SEEN   Trich, Wet Prep NONE SEEN NONE SEEN   Clue Cells Wet Prep HPF POC NONE SEEN NONE SEEN   WBC, Wet Prep HPF POC NONE SEEN NONE SEEN   Sperm NONE SEEN   HIV Antibody (routine testing w rflx)     Status: None   Collection Time: 06/01/19  4:50 PM  Result Value Ref Range   HIV Screen 4th Generation wRfx NON REACTIVE NON REACTIVE  CBC     Status: None   Collection Time: 06/01/19  4:50 PM  Result Value Ref Range   WBC 8.0 4.0 - 10.5 K/uL   RBC 4.75 3.87 - 5.11 MIL/uL   Hemoglobin 14.5 12.0 - 15.0 g/dL   HCT 42.4 36.0 - 46.0 %   MCV 89.3 80.0 - 100.0 fL   MCH 30.5 26.0 - 34.0 pg   MCHC 34.2 30.0 - 36.0 g/dL   RDW 13.0 11.5 - 15.5 %   Platelets 242 150 - 400 K/uL   nRBC 0.0 0.0 - 0.2 %   ABO/Rh     Status: None   Collection Time: 06/01/19  4:50 PM  Result  Value Ref Range   ABO/RH(D) A POS    No rh immune globuloin      NOT A RH IMMUNE GLOBULIN CANDIDATE, PT RH POSITIVE Performed at [facility], [address], [place], [place] [address]   hCG, quantitative, pregnancy     Status: Abnormal   Collection Time: 06/01/19  4:50 PM  Result Value Ref Range   hCG, Beta Chain, Quant, S 434 (H) <5 mIU/mL    MDM Patient is in stable condition, no acute distress.  UPT (+) CBC, ultrasound, beta-HCG, and ABO/Rh typing.  Gc/ct and wet prep pending.   Assessment and Plan  1. Abdominal pain and vaginal bleeding within first trimester  - pregnancy of unknown location  - trend quantitative HCG levels  - Tylenol for pain alleviation. Avoid use of NSAIDs.   Follow-up for quantitative B-HCG on Wednesday at [facility]. Patient is to return to MAU if she experiences increased blood loss, LOC, increased pain. All questions were answered and patient conveyed understanding.   Avelina Laine 06/01/2019, 7:29 PM   I confirm that I have verified the information documented in the physician assistant student's note and that I have also personally reperformed the history, physical exam and all medical decision making activities of this service and have verified that all service and findings are accurately documented in this student's note.   Diagnoses: Pregnancy of unknown anatomic location - Information provided on ectopic pregnancy - Plan repeat HCG in 48 hrs  Bleeding in early pregnancy  - Information provided on bleeding in pregnancy and threatened miscarriage - Return to MAU: If you have heavier bleeding that soaks through more that 2 pads per hour for an hour or more If you bleed so much that you feel like you might pass out or you do pass out If you have significant abdominal pain that is not improved with Tylenol  If you develop a fever > 100.5   - Discharge home - Keep  scheduled appt at [facility] on Wednesday 06/03/2019 at 9 AM. - Patient verbalized an understanding of the plan of care and agrees.    Raelyn Mora, CNM 06/01/2019 8:28 PM

## 2019-06-01 NOTE — Discharge Instructions (Signed)

## 2019-06-01 NOTE — MAU Note (Signed)
Crystalyn L Scarfone is a 34 y.o. here in MAU reporting: started spotting about 2 weeks ago and was feeling off so she took a pregnancy test and it was positive, last week. States the bleeding has continued and is now much heavier. States she had been wearing tampons and she would bleed through in about 2 hours. Is currently wearing a pad and states when using the restroom here she still noticed heavy bright red bleeding. Had some clots a few days ago but none since. Started cramping last night.   LMP: unknown, states she has been on birth control  Onset of complaint: ongoing for a couple of weeks but getting worse  Pain score: 4/10  Vitals:   06/01/19 1542  BP: (!) 141/99  Pulse: 87  Resp: 16  Temp: 98.4 F (36.9 C)  SpO2: 100%      Lab orders placed from triage: UA UPT

## 2019-06-02 LAB — GC/CHLAMYDIA PROBE AMP (~~LOC~~) NOT AT ARMC
Chlamydia: NEGATIVE
Comment: NEGATIVE
Comment: NORMAL
Neisseria Gonorrhea: NEGATIVE

## 2019-06-03 ENCOUNTER — Encounter: Payer: Self-pay | Admitting: *Deleted

## 2019-06-03 ENCOUNTER — Other Ambulatory Visit: Payer: Self-pay

## 2019-06-03 ENCOUNTER — Ambulatory Visit (INDEPENDENT_AMBULATORY_CARE_PROVIDER_SITE_OTHER): Payer: Self-pay | Admitting: *Deleted

## 2019-06-03 VITALS — BP 148/87 | HR 91 | Temp 98.2°F | Ht 66.0 in | Wt 162.0 lb

## 2019-06-03 DIAGNOSIS — O209 Hemorrhage in early pregnancy, unspecified: Secondary | ICD-10-CM

## 2019-06-03 DIAGNOSIS — O3680X0 Pregnancy with inconclusive fetal viability, not applicable or unspecified: Secondary | ICD-10-CM

## 2019-06-03 LAB — BETA HCG QUANT (REF LAB): hCG Quant: 625 m[IU]/mL

## 2019-06-03 NOTE — Progress Notes (Signed)
Pt here for stat BHCG following MAU visit on 1/18. She reports that her bleeding stopped yesterday and she is not having any pain. Lab drawn and pt advised she will be called later today with results and recommended follow up. Ectopic precautions reviewed. Pt voiced understanding of all information and instructions given.   1330  BHCG result reviewed w/Julie Magnus Sinning, PA.  Pt was called and informed of test result. She was advised that the level of hormone has not risen in such a way that would indicate a normally growing pregnancy. We expect that the level will begin to decrease and this will need to be checked again on Friday 1/22 with another Stat BHCG . Pt stated that since leaving our office, her bleeding has started again and is bright red. She is also having some menstrual-like cramping and an occasional sharp pain in RLQ abdomen. I discussed these sx with Vonzella Nipple. Pt was given instructions to take Tylenol, rest and monitor her sx. If the bleeding becomes >2 pads/hr or the cramping/pain increases in severity, pt should return to MAU for evaluation. Pt was given office appt on 1/22 @ 0830 for lab test. She voiced understanding and agreed to plan of care.

## 2019-06-05 ENCOUNTER — Ambulatory Visit (INDEPENDENT_AMBULATORY_CARE_PROVIDER_SITE_OTHER): Payer: Self-pay

## 2019-06-05 ENCOUNTER — Inpatient Hospital Stay (HOSPITAL_COMMUNITY)
Admission: AD | Admit: 2019-06-05 | Discharge: 2019-06-05 | Disposition: A | Discharge status: Home / Self Care | Payer: Self-pay | Attending: Family Medicine | Admitting: Family Medicine

## 2019-06-05 ENCOUNTER — Inpatient Hospital Stay (HOSPITAL_COMMUNITY): Payer: Self-pay

## 2019-06-05 ENCOUNTER — Encounter (HOSPITAL_COMMUNITY): Payer: Self-pay | Admitting: Family Medicine

## 2019-06-05 ENCOUNTER — Other Ambulatory Visit: Payer: Self-pay

## 2019-06-05 DIAGNOSIS — Z809 Family history of malignant neoplasm, unspecified: Secondary | ICD-10-CM | POA: Insufficient documentation

## 2019-06-05 DIAGNOSIS — Z833 Family history of diabetes mellitus: Secondary | ICD-10-CM | POA: Insufficient documentation

## 2019-06-05 DIAGNOSIS — Z87891 Personal history of nicotine dependence: Secondary | ICD-10-CM | POA: Insufficient documentation

## 2019-06-05 DIAGNOSIS — O3680X0 Pregnancy with inconclusive fetal viability, not applicable or unspecified: Secondary | ICD-10-CM

## 2019-06-05 DIAGNOSIS — O009 Unspecified ectopic pregnancy without intrauterine pregnancy: Secondary | ICD-10-CM | POA: Insufficient documentation

## 2019-06-05 DIAGNOSIS — O99519 Diseases of the respiratory system complicating pregnancy, unspecified trimester: Secondary | ICD-10-CM | POA: Insufficient documentation

## 2019-06-05 DIAGNOSIS — J45909 Unspecified asthma, uncomplicated: Secondary | ICD-10-CM | POA: Insufficient documentation

## 2019-06-05 DIAGNOSIS — O00201 Right ovarian pregnancy without intrauterine pregnancy: Secondary | ICD-10-CM

## 2019-06-05 HISTORY — DX: Unspecified ovarian cyst, unspecified side: N83.209

## 2019-06-05 LAB — COMPREHENSIVE METABOLIC PANEL
ALT: 23 U/L (ref 0–44)
AST: 22 U/L (ref 15–41)
Albumin: 4.1 g/dL (ref 3.5–5.0)
Alkaline Phosphatase: 50 U/L (ref 38–126)
Anion gap: 10 (ref 5–15)
BUN: 11 mg/dL (ref 6–20)
CO2: 23 mmol/L (ref 22–32)
Calcium: 9.3 mg/dL (ref 8.9–10.3)
Chloride: 105 mmol/L (ref 98–111)
Creatinine, Ser: 0.68 mg/dL (ref 0.44–1.00)
GFR calc Af Amer: >60 mL/min (ref >60)
GFR calc non Af Amer: >60 mL/min (ref >60)
Glucose, Bld: 100 mg/dL — ABNORMAL HIGH (ref 70–99)
Potassium: 4.2 mmol/L (ref 3.5–5.1)
Sodium: 138 mmol/L (ref 135–145)
Total Bilirubin: 0.6 mg/dL (ref 0.3–1.2)
Total Protein: 6.9 g/dL (ref 6.5–8.1)

## 2019-06-05 LAB — BETA HCG QUANT (REF LAB): hCG Quant: 679 m[IU]/mL

## 2019-06-05 MED ORDER — METHOTREXATE FOR ECTOPIC PREGNANCY
50.0000 mg/m2 | Freq: Once | INTRAMUSCULAR | Status: AC
  Administered 2019-06-05: 95 mg via INTRAMUSCULAR
  Filled 2019-06-05: qty 1

## 2019-06-05 NOTE — Progress Notes (Signed)
Pt here today for STAT Beta Lab s/p pregnancy unknown location.  Pt reports that she had some vaginal bleeding and mild pain but not as bad as before.  Pt advised that will call her around noon to give her results and f/u.  Pt verbalized understanding.    Received notification that pt's levels are 679.  Notified Dr. Vergie Living who recommended that pt go to MAU for MTX.  Called pt and notified pt results and the recommendation that she needs to go to MAU for MTX.  MAU charge nurse notified.  Pt verbalized understanding.   Addison Naegeli, RN 06/05/19

## 2019-06-05 NOTE — Discharge Instructions (Signed)
Ectopic Pregnancy ° °An ectopic pregnancy happens when a fertilized egg grows outside the womb (uterus). The fertilized egg cannot stay alive outside of the womb. This problem often happens in a fallopian tube. It is often caused by damage to the tube. °If this problem is found early, you may be treated with medicine that stops the egg from growing. If your tube tears or bursts open (ruptures), you will bleed inside. Often, there is very bad pain in the lower belly. This is an emergency. You will need surgery. Get help right away. °Follow these instructions at home: °After being treated with medicine or surgery: °· Rest and limit your activity for as long as told by your doctor. °· Until your doctor says that it is safe: °? Do not lift anything that is heavier than 10 lb (4.5 kg) or the limit that your doctor tells you. °? Avoid exercise and any movement that takes a lot of effort. °· To prevent problems when pooping (constipation): °? Eat a healthy diet. This includes: °§ Fruits. °§ Vegetables. °§ Whole grains. °? Drink 6-8 glasses of water a day. °Contact a doctor if: °Get help right away if: °· You have sudden and very bad pain in your belly. °· You have very bad pain in your shoulders or neck. °· You have pain that gets worse and is not helped by medicine. °· You have: °? A fever or chills. °? Vaginal bleeding. °? Redness or swelling at the site of a surgical cut (incision). °· You feel sick to your stomach (nauseous) or you throw up (vomit). °· You feel dizzy or weak. °· You feel light-headed or you pass out (faint). °Summary °· An ectopic pregnancy happens when a fertilized egg grows outside the womb (uterus). °· If this problem is found early, you may be treated with medicine that stops the egg from growing. °· If your tube tears or bursts open (ruptures), you will need surgery. This is an emergency. Get help right away. °This information is not intended to replace advice given to you by your health care  provider. Make sure you discuss any questions you have with your health care provider. °Document Revised: 04/12/2017 Document Reviewed: 05/24/2016 °Elsevier Patient Education © 2020 [employer] ° °

## 2019-06-05 NOTE — MAU Note (Signed)
Sent over for clinic, ? Ectopic, for methotrexate.  Noted lower abd seemed swollen/bloated, some pain and started having pain in her right shoulder.

## 2019-06-06 NOTE — MAU Provider Note (Addendum)
Patient Pamela Reilly is a 34 y.o. G2P0010 at [facility] here with complaints of mild menstrual cramps, and barely spotting. She was seen and diagnosed with a pregnancy of unknown location on 1/18 in MAU.  (see notes from that visit).   She was sent to MAU from [facility] today after her stat bHCG today showed an inappropriate rise in her quant. Today she reports that she is still having mild pain and bleeding but it is mild, not as bad as before.   History     CSN: [id number]  Arrival date and time: 06/05/19 1401   None     Chief Complaint  Patient presents with  . methotrexate work up   OB History    Gravida  2   Para      Term      Preterm      AB  1   Living        SAB      TAB  1   Ectopic      Multiple      Live Births              Past Medical History:  Diagnosis Date  . ADD (attention deficit disorder)   . ADHD (attention deficit hyperactivity disorder)   . Asthma    exercise induced  . Asthma due to seasonal allergies 07/29/2013  . Ovarian cyst     Past Surgical History:  Procedure Laterality Date  . APPENDECTOMY    . BREAST REDUCTION SURGERY    . BREAST SURGERY    . TONSILLECTOMY  2015    Family History  Problem Relation Age of Onset  . Diabetes Mother   . Hypertension Mother   . Hypertension Father   . Cancer Father     Social History   Tobacco Use  . Smoking status: Former Games developer  . Smokeless tobacco: Never Used  . Tobacco comment: was recreationally when younger  Substance Use Topics  . Alcohol use: Not Currently    Comment: social  . Drug use: Never    Allergies: No Known Allergies  No medications prior to admission.    Review of Systems  Constitutional: Negative.   HENT: Negative.   Respiratory: Negative.   Gastrointestinal: Positive for abdominal pain.  Genitourinary: Positive for vaginal bleeding.  Neurological: Negative.    Physical Exam   Blood pressure (!) 141/90, pulse 92, temperature 98.4 F (36.9  C), temperature source Oral, resp. rate 18, height 5\' 6"  (1.676 m), weight 73.4 kg, SpO2 100 %.  Physical Exam  Constitutional: She is oriented to person, place, and time. She appears well-developed.  HENT:  Head: Normocephalic.  Respiratory: Effort normal.  Musculoskeletal:     Cervical back: Normal range of motion.  Neurological: She is alert and oriented to person, place, and time.  Skin: Skin is warm.  Psychiatric: She has a normal mood and affect.    MAU Course  Procedures  Review of records shows the following trends in bHCG:  1. 1/18: 425  1/20: 650 1/22: 679 (at [facility]).  CMP is normal today.   Repeat 2/22 today shows suspicious right adnexal mass; measuring 1.4 x 1.3 x 1.3. This is a new finding from [place] on 1/20.  2/20 images personally reviewed by me.  Assessment and Plan   1. Ectopic pregnancy of right ovary   2. Pregnancy of unknown anatomic location    2. Received methotrexate in MAU; tolerated well.   3. Patient  to return to [facility] on Day 4 (1/25) and Day 7 1/28 for follow up quant. Appointments made for patient.  4. Explained how methotrexate works, and the importance of follow up to make sure that her levels are dropping.  5. Reviewed strict ectopic precautions 6. Patient had no questions; agrees with follow up plan.   Mervyn Skeeters Karl Knarr 06/06/2019, 7:55 AM

## 2019-06-08 ENCOUNTER — Other Ambulatory Visit: Payer: Self-pay

## 2019-06-08 ENCOUNTER — Other Ambulatory Visit (INDEPENDENT_AMBULATORY_CARE_PROVIDER_SITE_OTHER): Payer: Self-pay | Admitting: General Practice

## 2019-06-08 DIAGNOSIS — O00101 Right tubal pregnancy without intrauterine pregnancy: Secondary | ICD-10-CM

## 2019-06-08 LAB — BETA HCG QUANT (REF LAB): hCG Quant: 872 m[IU]/mL

## 2019-06-08 NOTE — Progress Notes (Signed)
Patient presents to office today for stat bhcg day #4 labs following MTX on 1/22. Patient denies bleeding, only having gas pain. Discussed with patient we are monitoring your bhcg levels today, results take approximately 2 hours to finalize and will be reviewed with a doctor in the office- we will call you with results/updated plan of care. Patient verbalized understanding and provided call back number [phone].  Reviewed results with Dr Adrian Blackwater who finds slight increase in bhcg levels, but not uncommon for day #4 labs- patient should follow up on Thursday for #7 labs.  Called patient and informed her of results, reviewed Thursday's lab appt, & reviewed ectopic precautions. Patient verbalized understanding and states she has had some chest pain today but has been very stressed/anxious. Advised patient she take a warm bath/shower & do something that usually relaxes her like watching a funny show/movie- if chest pain continues or other symptoms develop to go back to MAU. Patient verbalized understanding.  Chase Caller RN BSN 06/08/19

## 2019-06-09 NOTE — Progress Notes (Signed)
Chart reviewed - agree with CMA/RN documentation.  ° °

## 2019-06-11 ENCOUNTER — Encounter (HOSPITAL_COMMUNITY): Payer: Self-pay | Admitting: Obstetrics and Gynecology

## 2019-06-11 ENCOUNTER — Inpatient Hospital Stay (HOSPITAL_COMMUNITY)
Admission: AD | Admit: 2019-06-11 | Discharge: 2019-06-12 | Disposition: A | Discharge status: Home / Self Care | Payer: Self-pay | Attending: Obstetrics and Gynecology | Admitting: Obstetrics and Gynecology

## 2019-06-11 ENCOUNTER — Ambulatory Visit (INDEPENDENT_AMBULATORY_CARE_PROVIDER_SITE_OTHER): Payer: Self-pay | Admitting: General Practice

## 2019-06-11 ENCOUNTER — Encounter (HOSPITAL_COMMUNITY)
Admission: AD | Disposition: A | Discharge status: Home / Self Care | Payer: Self-pay | Source: Home / Self Care | Attending: Obstetrics and Gynecology

## 2019-06-11 ENCOUNTER — Other Ambulatory Visit: Payer: Self-pay

## 2019-06-11 ENCOUNTER — Inpatient Hospital Stay (HOSPITAL_COMMUNITY): Payer: Self-pay

## 2019-06-11 DIAGNOSIS — O00101 Right tubal pregnancy without intrauterine pregnancy: Secondary | ICD-10-CM | POA: Insufficient documentation

## 2019-06-11 DIAGNOSIS — K661 Hemoperitoneum: Secondary | ICD-10-CM | POA: Insufficient documentation

## 2019-06-11 DIAGNOSIS — Z87891 Personal history of nicotine dependence: Secondary | ICD-10-CM | POA: Insufficient documentation

## 2019-06-11 DIAGNOSIS — R109 Unspecified abdominal pain: Secondary | ICD-10-CM

## 2019-06-11 DIAGNOSIS — Z79899 Other long term (current) drug therapy: Secondary | ICD-10-CM | POA: Insufficient documentation

## 2019-06-11 DIAGNOSIS — J45909 Unspecified asthma, uncomplicated: Secondary | ICD-10-CM | POA: Insufficient documentation

## 2019-06-11 DIAGNOSIS — O26891 Other specified pregnancy related conditions, first trimester: Secondary | ICD-10-CM

## 2019-06-11 DIAGNOSIS — O009 Unspecified ectopic pregnancy without intrauterine pregnancy: Secondary | ICD-10-CM

## 2019-06-11 DIAGNOSIS — O00201 Right ovarian pregnancy without intrauterine pregnancy: Secondary | ICD-10-CM

## 2019-06-11 DIAGNOSIS — Z9889 Other specified postprocedural states: Secondary | ICD-10-CM

## 2019-06-11 DIAGNOSIS — Z20822 Contact with and (suspected) exposure to covid-19: Secondary | ICD-10-CM | POA: Insufficient documentation

## 2019-06-11 HISTORY — PX: LAPAROSCOPIC UNILATERAL SALPINGECTOMY: SHX5934

## 2019-06-11 HISTORY — PX: DIAGNOSTIC LAPAROSCOPY WITH REMOVAL OF ECTOPIC PREGNANCY: SHX6449

## 2019-06-11 LAB — RESPIRATORY PANEL BY RT PCR (FLU A&B, COVID)
Influenza A by PCR: NEGATIVE
Influenza B by PCR: NEGATIVE
SARS Coronavirus 2 by RT PCR: NEGATIVE

## 2019-06-11 LAB — BETA HCG QUANT (REF LAB): hCG Quant: 885 m[IU]/mL

## 2019-06-11 LAB — CBC
HCT: 39.9 % (ref 36.0–46.0)
HCT: 42 % (ref 36.0–46.0)
Hemoglobin: 13.7 g/dL (ref 12.0–15.0)
Hemoglobin: 14.1 g/dL (ref 12.0–15.0)
MCH: 31.2 pg (ref 26.0–34.0)
MCH: 30.7 pg (ref 26.0–34.0)
MCHC: 34.3 g/dL (ref 30.0–36.0)
MCHC: 33.6 g/dL (ref 30.0–36.0)
MCV: 90.9 fL (ref 80.0–100.0)
MCV: 91.5 fL (ref 80.0–100.0)
Platelets: 257 10*3/uL (ref 150–400)
Platelets: 258 10*3/uL (ref 150–400)
RBC: 4.39 MIL/uL (ref 3.87–5.11)
RBC: 4.59 MIL/uL (ref 3.87–5.11)
RDW: 12.9 % (ref 11.5–15.5)
RDW: 13 % (ref 11.5–15.5)
WBC: 10.1 10*3/uL (ref 4.0–10.5)
WBC: 9.8 10*3/uL (ref 4.0–10.5)
nRBC: 0 % (ref 0.0–0.2)
nRBC: 0 % (ref 0.0–0.2)

## 2019-06-11 LAB — TYPE AND SCREEN
ABO/RH(D): A POS
Antibody Screen: NEGATIVE

## 2019-06-11 SURGERY — LAPAROSCOPY, WITH ECTOPIC PREGNANCY SURGICAL TREATMENT
Anesthesia: General | Laterality: Right

## 2019-06-11 MED ORDER — SODIUM CHLORIDE 0.9 % IR SOLN
Status: DC | PRN
  Administered 2019-06-12: 1000 mL

## 2019-06-11 MED ORDER — PROPOFOL 10 MG/ML IV BOLUS
INTRAVENOUS | Status: AC
  Filled 2019-06-11: qty 40

## 2019-06-11 MED ORDER — HYDROMORPHONE HCL 1 MG/ML IJ SOLN
0.5000 mg | Freq: Once | INTRAMUSCULAR | Status: AC
  Administered 2019-06-11: 0.5 mg via INTRAMUSCULAR
  Filled 2019-06-11: qty 1

## 2019-06-11 MED ORDER — FENTANYL CITRATE (PF) 250 MCG/5ML IJ SOLN
INTRAMUSCULAR | Status: AC
  Filled 2019-06-11: qty 5

## 2019-06-11 MED ORDER — FENTANYL CITRATE (PF) 100 MCG/2ML IJ SOLN
50.0000 ug | Freq: Once | INTRAMUSCULAR | Status: AC
  Administered 2019-06-12: 50 ug via INTRAVENOUS
  Administered 2019-06-12: 01:00:00 100 ug via INTRAVENOUS
  Administered 2019-06-12 (×2): 50 ug via INTRAVENOUS

## 2019-06-11 MED ORDER — BUPIVACAINE HCL (PF) 0.5 % IJ SOLN
INTRAMUSCULAR | Status: AC
  Filled 2019-06-11: qty 30

## 2019-06-11 MED ORDER — MIDAZOLAM HCL 2 MG/2ML IJ SOLN
INTRAMUSCULAR | Status: AC
  Filled 2019-06-11: qty 2

## 2019-06-11 SURGICAL SUPPLY — 43 items
ADH SKN CLS APL DERMABOND .7 (GAUZE/BANDAGES/DRESSINGS) ×1
APL SWBSTK 6 STRL LF DISP (MISCELLANEOUS) ×1
APPLICATOR COTTON TIP 6 STRL (MISCELLANEOUS) ×1 IMPLANT
APPLICATOR COTTON TIP 6IN STRL (MISCELLANEOUS) ×3
BAG SPEC RTRVL LRG 6X4 10 (ENDOMECHANICALS) ×1
BLADE SURG 15 STRL LF DISP TIS (BLADE) ×1 IMPLANT
BLADE SURG 15 STRL SS (BLADE) ×3
CABLE HIGH FREQUENCY MONO STRZ (ELECTRODE) IMPLANT
DEFOGGER SCOPE WARMER CLEARIFY (MISCELLANEOUS) ×3 IMPLANT
DERMABOND ADVANCED (GAUZE/BANDAGES/DRESSINGS) ×2
DERMABOND ADVANCED .7 DNX12 (GAUZE/BANDAGES/DRESSINGS) ×1 IMPLANT
DRSG OPSITE POSTOP 3X4 (GAUZE/BANDAGES/DRESSINGS) ×3 IMPLANT
DURAPREP 26ML APPLICATOR (WOUND CARE) ×3 IMPLANT
ELECT REM PT RETURN 9FT ADLT (ELECTROSURGICAL) ×3
ELECTRODE REM PT RTRN 9FT ADLT (ELECTROSURGICAL) ×1 IMPLANT
GLOVE BIOGEL PI IND STRL 7.0 (GLOVE) ×2 IMPLANT
GLOVE BIOGEL PI IND STRL 7.5 (GLOVE) ×2 IMPLANT
GLOVE BIOGEL PI INDICATOR 7.0 (GLOVE) ×4
GLOVE BIOGEL PI INDICATOR 7.5 (GLOVE) ×4
GLOVE SURG SS PI 7.0 STRL IVOR (GLOVE) ×3 IMPLANT
GOWN STRL REUS W/ TWL LRG LVL3 (GOWN DISPOSABLE) ×3 IMPLANT
GOWN STRL REUS W/TWL LRG LVL3 (GOWN DISPOSABLE) ×9
KIT TURNOVER KIT B (KITS) ×3 IMPLANT
LIGASURE VESSEL 5MM BLUNT TIP (ELECTROSURGICAL) ×2 IMPLANT
NS IRRIG 1000ML POUR BTL (IV SOLUTION) ×3 IMPLANT
PACK LAPAROSCOPY BASIN (CUSTOM PROCEDURE TRAY) ×3 IMPLANT
PACK TRENDGUARD 450 HYBRID PRO (MISCELLANEOUS) IMPLANT
PAD OB MATERNITY 4.3X12.25 (PERSONAL CARE ITEMS) ×3 IMPLANT
POUCH LAPAROSCOPIC INSTRUMENT (MISCELLANEOUS) ×3 IMPLANT
POUCH SPECIMEN RETRIEVAL 10MM (ENDOMECHANICALS) ×2 IMPLANT
PROTECTOR NERVE ULNAR (MISCELLANEOUS) ×6 IMPLANT
SCISSORS LAP 5X35 DISP (ENDOMECHANICALS) IMPLANT
SET IRRIG TUBING LAPAROSCOPIC (IRRIGATION / IRRIGATOR) ×3 IMPLANT
SET TUBE SMOKE EVAC HIGH FLOW (TUBING) ×3 IMPLANT
SLEEVE ADV FIXATION 5X100MM (TROCAR) IMPLANT
SUT MON AB 4-0 PS1 27 (SUTURE) ×2 IMPLANT
SUT VICRYL 0 UR6 27IN ABS (SUTURE) ×3 IMPLANT
SYR 10ML LL (SYRINGE) ×3 IMPLANT
TOWEL GREEN STERILE FF (TOWEL DISPOSABLE) ×6 IMPLANT
TRAY FOLEY W/BAG SLVR 14FR (SET/KITS/TRAYS/PACK) ×3 IMPLANT
TRENDGUARD 450 HYBRID PRO PACK (MISCELLANEOUS) ×3
TROCAR ADV FIXATION 5X100MM (TROCAR) ×3 IMPLANT
TROCAR BALLN 12MMX100 BLUNT (TROCAR) ×3 IMPLANT

## 2019-06-11 NOTE — MAU Provider Note (Addendum)
Chief Complaint: Vaginal Bleeding, Abdominal Pain, Back Pain, and Ectopic Pregnancy   First Provider Initiated Contact with Patient 06/11/19 1836     SUBJECTIVE HPI: Pamela Reilly is a 34 y.o. G2P0010 at Unknown who presents to Maternity Admissions reporting abdominal pain. Had methotrexate on 1/22 for right adnexal mass. Went to [employer] today for her day 7 HCG which showed increase (1/22=679, 1/25=872, today=885).  Patient reports increase in abdominal pain last night that has gradually gotten worse throughout the day today. Describes constant abdominal cramping & pressure. Also reports pain in her back.   Location: abdomen & back Quality: cramping & pressure Severity: 8/10 on pain scale Duration: 1 day Timing: constant Modifying factors: lying flat & touch makes worse Associated signs and symptoms: none  Past Medical History:  Diagnosis Date  . ADHD (attention deficit hyperactivity disorder)   . Asthma    exercise induced  . Asthma due to seasonal allergies 07/29/2013  . Ovarian cyst    OB History  Gravida Para Term Preterm AB Living  2       1    SAB TAB Ectopic Multiple Live Births    1          # Outcome Date GA Lbr Len/2nd Weight Sex Delivery Anes PTL Lv  2 Current           1 TAB            Past Surgical History:  Procedure Laterality Date  . APPENDECTOMY    . BREAST REDUCTION SURGERY    . TONSILLECTOMY  2015   Social History   Socioeconomic History  . Marital status: Single    Spouse name: Not on file  . Number of children: Not on file  . Years of education: Not on file  . Highest education level: Not on file  Occupational History  . Not on file  Tobacco Use  . Smoking status: Former Smoker    Types: Cigarettes  . Smokeless tobacco: Never Used  . Tobacco comment: was recreationally when younger  Substance and Sexual Activity  . Alcohol use: Not Currently    Comment: social  . Drug use: Never  . Sexual activity: Yes    Birth control/protection: None   Other Topics Concern  . Not on file  Social History Narrative  . Not on file   Social Determinants of Health   Financial Resource Strain:   . Difficulty of Paying Living Expenses: Not on file  Food Insecurity:   . Worried About Programme researcher, broadcasting/film/video in the Last Year: Not on file  . Ran Out of Food in the Last Year: Not on file  Transportation Needs:   . Lack of Transportation (Medical): Not on file  . Lack of Transportation (Non-Medical): Not on file  Physical Activity:   . Days of Exercise per Week: Not on file  . Minutes of Exercise per Session: Not on file  Stress:   . Feeling of Stress : Not on file  Social Connections:   . Frequency of Communication with Friends and Family: Not on file  . Frequency of Social Gatherings with Friends and Family: Not on file  . Attends Religious Services: Not on file  . Active Member of Clubs or Organizations: Not on file  . Attends Banker Meetings: Not on file  . Marital Status: Not on file  Intimate Partner Violence:   . Fear of Current or Ex-Partner: Not on file  . Emotionally Abused: Not  on file  . Physically Abused: Not on file  . Sexually Abused: Not on file   Family History  Problem Relation Age of Onset  . Diabetes Mother   . Hypertension Mother   . Hypertension Father   . Cancer Father    No current facility-administered medications on file prior to encounter.   Current Outpatient Medications on File Prior to Encounter  Medication Sig Dispense Refill  . albuterol (PROVENTIL HFA;VENTOLIN HFA) 108 (90 Base) MCG/ACT inhaler Inhale 2 puffs into the lungs every 6 (six) hours as needed for wheezing or shortness of breath. 1 Inhaler 2   No Known Allergies  I have reviewed patient's Past Medical Hx, Surgical Hx, Family Hx, Social Hx, medications and allergies.   Review of Systems  Constitutional: Negative.   Gastrointestinal: Positive for abdominal pain.  Genitourinary: Positive for vaginal bleeding.     OBJECTIVE Patient Vitals for the past 24 hrs:  BP Temp Temp src Pulse Resp SpO2 Height Weight  06/11/19 1826 (!) 144/88 98.4 F (36.9 C) Oral 93 18 99 % 5\' 6"  (1.676 m) 74 kg   Constitutional: Well-developed, well-nourished female in no acute distress.  Cardiovascular: normal rate & rhythm, no murmur Respiratory: normal rate and effort. Lung sounds clear throughout GI: TTP throughout abdomen. Soft. No rebound MS: Extremities nontender, no edema, normal ROM Neurologic: Alert and oriented x 4.     LAB RESULTS Results for orders placed or performed during the hospital encounter of 06/11/19 (from the past 24 hour(s))  CBC     Status: None   Collection Time: 06/11/19  7:20 PM  Result Value Ref Range   WBC 10.1 4.0 - 10.5 K/uL   RBC 4.39 3.87 - 5.11 MIL/uL   Hemoglobin 13.7 12.0 - 15.0 g/dL   HCT 06/13/19 37.8 - 58.8 %   MCV 90.9 80.0 - 100.0 fL   MCH 31.2 26.0 - 34.0 pg   MCHC 34.3 30.0 - 36.0 g/dL   RDW 50.2 77.4 - 12.8 %   Platelets 257 150 - 400 K/uL   nRBC 0.0 0.0 - 0.2 %  Type and screen     Status: None (Preliminary result)   Collection Time: 06/11/19  7:20 PM  Result Value Ref Range   ABO/RH(D) PENDING    Antibody Screen PENDING    Sample Expiration      06/14/2019,2359 Performed at [facility], [address] [place], [place]     IMAGING No results found.  MAU COURSE Orders Placed This Encounter  Procedures  . [address] OB Transvaginal  . CBC  . Diet NPO time specified  . Type and screen   No orders of the defined types were placed in this encounter.   MDM Patient sent here for 2nd dose of MTX. Patient tearful & complaining of 8/10 pain. TTP throughout lower abdomen. Dr. [place] called to bedside to assess patient. Recommends CBC, NPO, & ultrasound.   Care turned over to Vergie Living CNM Wynelle Bourgeois, NP 06/11/2019  7:58 PM  Complaining of new bleeding and increased pain Will give a half dose of Dilaudid IM while waiting on  imaging  06/13/2019 OB Transvaginal  Result Date: 06/11/2019 CLINICAL DATA:  Known ectopic pregnancy. Status post methotrexate. Increasing pain. EXAM: TRANSVAGINAL OB ULTRASOUND TECHNIQUE: Transvaginal ultrasound was performed for complete evaluation of the gestation as well as the maternal uterus, adnexal regions, and pelvic cul-de-sac. COMPARISON:  None. FINDINGS: Intrauterine gestational sac: None Yolk sac:  Not visualized Embryo:  Not visualized Cardiac Activity: Heart Rate:  bpm MSD:   mm    w     d CRL:     mm    w  d                  [place] EDC: Subchorionic hemorrhage:  None visualized. Maternal uterus/adnexae: There is a solid mass in the right adnexa medial to the right ovary measuring 2.1 x 2.0 x 1.6 cm. This previously measured 1.4 x 1.3 x 1.3 cm. Moderate to large amount of free fluid in the pelvis. IMPRESSION: Enlarging right ectopic pregnancy with moderate to large free fluid raising the possibility of ruptured ectopic pregnancy. Critical Value/emergent results were called by telephone at the time of interpretation on 06/11/2019 at 9:44 pm to provider Lelan Pons , who verbally acknowledged these results. Electronically Signed   By: Rolm Baptise M.D.   On: 06/11/2019 21:44   Dr Ilda Basset in to discuss surgery Plan per MD Seabron Spates, CNM

## 2019-06-11 NOTE — MAU Note (Signed)
Sent over for 2nd dose of methotrexate. Pt reports increased pain/pressure started in lower abd last night and has increased all day, also in lower back.  Started bleeding today

## 2019-06-11 NOTE — Progress Notes (Signed)
Patient presents to office today for stat bhcg day #7 labs following MTX on 1/22. Patient reports increased pressure in lower pelvis, constipation, and rectal bleeding as a result. Patient denies vaginal bleeding. Recommended Miralax until sufficient bowel movement then stool softeners for several days following. Discussed with patient we are monitoring your bhcg levels today, results take approximately 2 hours to finalize and will be reviewed with a doctor in the office- we will call you with results/updated plan of care. Patient verbalized understanding and provided call back number [phone].  Reviewed results with Dr Alysia Penna who finds inappropriate rise in bhcg levels, patient needs to return to MAU for 2nd dose of MTX.  Called patient & informed her of results. Advised her to return to MAU this evening for 2nd dose of MTX. Patient verbalized understanding.   Chase Caller RN BSN 06/11/19

## 2019-06-11 NOTE — H&P (Signed)
Obstetrics & Gynecology H&P   Date of Admission: 06/11/2019   Requesting Provider: Maternity Admissions Unit  Primary OBGYN: Center for [facility] Primary Care Provider: Practice, [facility]  Reason for Admission: right ectopic pregnancy  History of Present Illness: Pamela Reilly is a 34 y.o. G2P0020 (Patient's last menstrual period was 04/17/2019.), with the above CC. PMHx is significant for h/o appy.  Patient diagnosed on 1/22 with right sided ectopic pregnancy and given MTx. Today's day 7 beta hcg rose vs day 4 so she was sent to [facility] for management. Throughout the day she's had increased pain and discomfort. NPO since noon.   U/s today showed more hemoperitoneum and larger right sided ectopic pregnancy.   ROS: A 12-point review of systems was performed and negative, except as stated in the above HPI.  OBGYN History: As per HPI. OB History  Gravida Para Term Preterm AB Living  2       1    SAB TAB Ectopic Multiple Live Births    1          # Outcome Date GA Lbr Len/2nd Weight Sex Delivery Anes PTL Lv  2 Current           1 TAB             Past Medical History: Past Medical History:  Diagnosis Date  . ADHD (attention deficit hyperactivity disorder)   . Asthma    exercise induced  . Asthma due to seasonal allergies 07/29/2013  . Ovarian cyst     Past Surgical History: Past Surgical History:  Procedure Laterality Date  . APPENDECTOMY    . BREAST REDUCTION SURGERY    . TONSILLECTOMY  2015    Family History:  Family History  Problem Relation Age of Onset  . Diabetes Mother   . Hypertension Mother   . Hypertension Father   . Cancer Father     Social History:  Social History   Socioeconomic History  . Marital status: Single    Spouse name: Not on file  . Number of children: Not on file  . Years of education: Not on file  . Highest education level: Not on file  Occupational History  . Not on file  Tobacco Use  . Smoking status: Former  Smoker    Types: Cigarettes  . Smokeless tobacco: Never Used  . Tobacco comment: was recreationally when younger  Substance and Sexual Activity  . Alcohol use: Not Currently    Comment: social  . Drug use: Never  . Sexual activity: Yes    Birth control/protection: None  Other Topics Concern  . Not on file  Social History Narrative  . Not on file   Social Determinants of Health   Financial Resource Strain:   . Difficulty of Paying Living Expenses: Not on file  Food Insecurity:   . Worried About Programme researcher, broadcasting/film/video in the Last Year: Not on file  . Ran Out of Food in the Last Year: Not on file  Transportation Needs:   . Lack of Transportation (Medical): Not on file  . Lack of Transportation (Non-Medical): Not on file  Physical Activity:   . Days of Exercise per Week: Not on file  . Minutes of Exercise per Session: Not on file  Stress:   . Feeling of Stress : Not on file  Social Connections:   . Frequency of Communication with Friends and Family: Not on file  . Frequency of Social Gatherings with Friends and  Family: Not on file  . Attends Religious Services: Not on file  . Active Member of Clubs or Organizations: Not on file  . Attends Archivist Meetings: Not on file  . Marital Status: Not on file  Intimate Partner Violence:   . Fear of Current or Ex-Partner: Not on file  . Emotionally Abused: Not on file  . Physically Abused: Not on file  . Sexually Abused: Not on file     Allergy: No Known Allergies  Current Outpatient Medications: Medications Prior to Admission  Medication Sig Dispense Refill Last Dose  . albuterol (PROVENTIL HFA;VENTOLIN HFA) 108 (90 Base) MCG/ACT inhaler Inhale 2 puffs into the lungs every 6 (six) hours as needed for wheezing or shortness of breath. 1 Inhaler 2 More than a month at Unknown time     Hospital Medications: Current Facility-Administered Medications  Medication Dose Route Frequency Provider Last Rate Last Admin  .  fentaNYL (SUBLIMAZE) injection 50 mcg  50 mcg Intravenous Once Aletha Halim, MD         Physical Exam:  Current Vital Signs 24h Vital Sign Ranges  T 98.4 F (36.9 C) Temp  Avg: 98.4 F (36.9 C)  Min: 98.4 F (36.9 C)  Max: 98.4 F (36.9 C)  BP (!) 144/89 BP  Min: 144/89  Max: 144/89  HR 94 Pulse  Avg: 93.5  Min: 93  Max: 94  RR 17 Resp  Avg: 17.5  Min: 17  Max: 18  SaO2 100 %   SpO2  Avg: 99.5 %  Min: 99 %  Max: 100 %       24 Hour I/O Current Shift I/O  Time Ins Outs No intake/output data recorded. No intake/output data recorded.   Patient Vitals for the past 24 hrs:  BP Temp Temp src Pulse Resp SpO2 Height Weight  06/11/19 2134 (!) 144/89 -- -- 94 17 100 % -- --  06/11/19 1826 (!) 144/88 98.4 F (36.9 C) Oral 93 18 99 % 5\' 6"  (1.676 m) 74 kg    Body mass index is 26.34 kg/m. General appearance: Well nourished, well developed female in no acute distress.  Cardiovascular: S1, S2 normal, no murmur, rub or gallop, regular rate and rhythm Respiratory:  Clear to auscultation bilateral. Normal respiratory effort Abdomen: soft, nd. Diffuse ttp in lower belly, no peritoneal s/s.  Neuro/Psych:  Normal mood and affect.  Skin:  Warm and dry.  Extremities: no clubbing, cyanosis, or edema.    Laboratory: COVID: neg Recent Labs  Lab 06/11/19 1920 06/11/19 2220  WBC 10.1 9.8  HGB 13.7 14.1  HCT 39.9 42.0  PLT 257 258   Recent Labs  Lab 06/05/19 1411  NA 138  K 4.2  CL 105  CO2 23  BUN 11  CREATININE 0.68  CALCIUM 9.3  PROT 6.9  BILITOT 0.6  ALKPHOS 50  ALT 23  AST 22  GLUCOSE 100*   No results for input(s): APTT, INR, PTT in the last 168 hours.  Invalid input(s): DRHAPTT Recent Labs  Lab 06/11/19 1920  [place] A POS   Results for Pamela Reilly, Pamela Reilly (MRN [id number]) as of 06/11/2019 23:32  Ref. Range 06/03/2019 10:18 06/05/2019 08:50 06/05/2019 14:11 06/08/2019 14:36 06/11/2019 13:41  hCG Quant Latest Units: mIU/mL 625 679  761 950   Imaging:  CLINICAL DATA:   Known ectopic pregnancy. Status post methotrexate. Increasing pain.  EXAM: TRANSVAGINAL OB ULTRASOUND  TECHNIQUE: Transvaginal ultrasound was performed for complete evaluation of the gestation as well as  the maternal uterus, adnexal regions, and pelvic cul-de-sac.  COMPARISON:  None.  FINDINGS: Intrauterine gestational sac: None  Yolk sac:  Not visualized  Embryo:  Not visualized  Cardiac Activity:  Heart Rate:  bpm  MSD:   mm    w     d  CRL:     mm    w  d                  [place] EDC:  Subchorionic hemorrhage:  None visualized.  Maternal uterus/adnexae: There is a solid mass in the right adnexa medial to the right ovary measuring 2.1 x 2.0 x 1.6 cm. This previously measured 1.4 x 1.3 x 1.3 cm. Moderate to large amount of free fluid in the pelvis.  IMPRESSION: Enlarging right ectopic pregnancy with moderate to large free fluid raising the possibility of ruptured ectopic pregnancy.  Critical Value/emergent results were called by telephone at the time of interpretation on 06/11/2019 at 9:44 pm to provider Hilda Lias , who verbally acknowledged these results.   Electronically Signed   By: Charlett Nose M.D.   On: 06/11/2019 21:44  Assessment: Ms. Posas is a 34 y.o. G2P0020 with right sided ectopic s/p failed MTx therapy. Pt statble  Plan: D/w her that given increased pain and increase in size of ectopic and increase in quant that I recommend l/s right salpingectomy which she is amenable to. Can proceed when OR is ready  Cornelia Copa. MD [phone] Attending Center for [facility])

## 2019-06-11 NOTE — Anesthesia Preprocedure Evaluation (Addendum)
Anesthesia Evaluation  Patient identified by MRN, date of birth, ID band Patient awake    Reviewed: Allergy & Precautions, NPO status , Patient's Chart, lab work & pertinent test results  History of Anesthesia Complications Negative for: history of anesthetic complications  Airway Mallampati: I  TM Distance: <3 FB Neck ROM: Full    Dental  (+) Teeth Intact, Dental Advisory Given   Pulmonary neg shortness of breath, asthma , neg recent URI, former smoker,    breath sounds clear to auscultation       Cardiovascular negative cardio ROS   Rhythm:Regular     Neuro/Psych PSYCHIATRIC DISORDERS negative neurological ROS     GI/Hepatic negative GI ROS, Neg liver ROS,   Endo/Other  negative endocrine ROS  Renal/GU negative Renal ROS     Musculoskeletal negative musculoskeletal ROS (+)   Abdominal   Peds  Hematology negative hematology ROS (+)   Anesthesia Other Findings   Reproductive/Obstetrics (+) Pregnancy Ectopic pregnancy                            Anesthesia Physical Anesthesia Plan  ASA: II and emergent  Anesthesia Plan: General   Post-op Pain Management:    Induction: Intravenous, Rapid sequence and Cricoid pressure planned  PONV Risk Score and Plan: Ondansetron and Dexamethasone  Airway Management Planned: Oral ETT  Additional Equipment: None  Intra-op Plan:   Post-operative Plan: Extubation in OR  Informed Consent: I have reviewed the patients History and Physical, chart, labs and discussed the procedure including the risks, benefits and alternatives for the proposed anesthesia with the patient or authorized representative who has indicated his/her understanding and acceptance.     Dental advisory given  Plan Discussed with: Anesthesiologist and Surgeon  Anesthesia Plan Comments:        Anesthesia Quick Evaluation

## 2019-06-11 NOTE — MAU Note (Signed)
Covid swab obtained without difficulty and pt tol well. No symptoms 

## 2019-06-11 NOTE — Progress Notes (Signed)
GYN Note D/w her re: u/s findings and recommend laparoscopy and right salpingectomy after failed MTx and hemoperitoneum more than before with more pain. Pt amenable to surgery. Can proceed when OR is ready  CBC Latest Ref Rng & Units 06/11/2019 06/11/2019 06/01/2019  WBC 4.0 - 10.5 K/uL 9.8 10.1 8.0  Hemoglobin 12.0 - 15.0 g/dL 89.3 81.0 17.5  Hematocrit 36.0 - 46.0 % 42.0 39.9 42.4  Platelets 150 - 400 K/uL 258 257 242   Cornelia Copa MD Attending Center for [facility] 06/11/2019 Time: 2300

## 2019-06-12 ENCOUNTER — Encounter: Payer: Self-pay | Admitting: *Deleted

## 2019-06-12 ENCOUNTER — Inpatient Hospital Stay (HOSPITAL_COMMUNITY): Payer: Self-pay | Admitting: Anesthesiology

## 2019-06-12 DIAGNOSIS — O00101 Right tubal pregnancy without intrauterine pregnancy: Secondary | ICD-10-CM

## 2019-06-12 DIAGNOSIS — K661 Hemoperitoneum: Secondary | ICD-10-CM

## 2019-06-12 MED ORDER — LIDOCAINE HCL (CARDIAC) PF 100 MG/5ML IV SOSY
PREFILLED_SYRINGE | INTRAVENOUS | Status: DC | PRN
  Administered 2019-06-12: 50 mg via INTRATRACHEAL

## 2019-06-12 MED ORDER — IBUPROFEN 600 MG PO TABS: 600.0000 mg | ORAL_TABLET | Freq: Four times a day (QID) | ORAL | 0 refills | Status: DC | PRN

## 2019-06-12 MED ORDER — DIPHENHYDRAMINE HCL 50 MG/ML IJ SOLN
INTRAMUSCULAR | Status: DC | PRN
  Administered 2019-06-12: 12.5 mg via INTRAVENOUS

## 2019-06-12 MED ORDER — FENTANYL CITRATE (PF) 100 MCG/2ML IJ SOLN
25.0000 ug | INTRAMUSCULAR | Status: DC | PRN
  Administered 2019-06-12: 50 ug via INTRAVENOUS
  Administered 2019-06-12: 25 ug via INTRAVENOUS

## 2019-06-12 MED ORDER — PROPOFOL 10 MG/ML IV BOLUS
INTRAVENOUS | Status: DC | PRN
  Administered 2019-06-12: 160 ug via INTRAVENOUS

## 2019-06-12 MED ORDER — SUCCINYLCHOLINE CHLORIDE 200 MG/10ML IV SOSY
PREFILLED_SYRINGE | INTRAVENOUS | Status: AC
  Filled 2019-06-12: qty 10

## 2019-06-12 MED ORDER — ACETAMINOPHEN 10 MG/ML IV SOLN: 1000.0000 mg | Freq: Once | INTRAVENOUS | Status: DC | PRN

## 2019-06-12 MED ORDER — ROCURONIUM 10MG/ML (10ML) SYRINGE FOR MEDFUSION PUMP - OPTIME
INTRAVENOUS | Status: DC | PRN
  Administered 2019-06-12: 50 mg via INTRAVENOUS

## 2019-06-12 MED ORDER — KETOROLAC TROMETHAMINE 30 MG/ML IJ SOLN
INTRAMUSCULAR | Status: AC
  Filled 2019-06-12: qty 1

## 2019-06-12 MED ORDER — KETOROLAC TROMETHAMINE 30 MG/ML IJ SOLN
INTRAMUSCULAR | Status: DC | PRN
  Administered 2019-06-12: 30 mg via INTRAVENOUS

## 2019-06-12 MED ORDER — ACETAMINOPHEN 500 MG PO TABS: 1000.0000 mg | ORAL_TABLET | Freq: Three times a day (TID) | ORAL | 1 refills | Status: DC | PRN

## 2019-06-12 MED ORDER — FENTANYL CITRATE (PF) 100 MCG/2ML IJ SOLN
INTRAMUSCULAR | Status: AC
  Filled 2019-06-12: qty 2

## 2019-06-12 MED ORDER — ROCURONIUM BROMIDE 10 MG/ML (PF) SYRINGE
PREFILLED_SYRINGE | INTRAVENOUS | Status: AC
  Filled 2019-06-12: qty 10

## 2019-06-12 MED ORDER — DOCUSATE SODIUM 100 MG PO CAPS: 100.0000 mg | ORAL_CAPSULE | Freq: Two times a day (BID) | ORAL | 1 refills | Status: AC

## 2019-06-12 MED ORDER — SODIUM CHLORIDE (PF) 0.9 % IJ SOLN
INTRAMUSCULAR | Status: AC
  Filled 2019-06-12: qty 10

## 2019-06-12 MED ORDER — OXYCODONE-ACETAMINOPHEN 5-325 MG PO TABS: 1.0000 | ORAL_TABLET | Freq: Four times a day (QID) | ORAL | 0 refills | Status: DC | PRN

## 2019-06-12 MED ORDER — DIPHENHYDRAMINE HCL 50 MG/ML IJ SOLN
INTRAMUSCULAR | Status: AC
  Filled 2019-06-12: qty 1

## 2019-06-12 MED ORDER — DOCUSATE SODIUM 100 MG PO CAPS: 100.0000 mg | ORAL_CAPSULE | Freq: Two times a day (BID) | ORAL | 1 refills | Status: DC

## 2019-06-12 MED ORDER — PROMETHAZINE HCL 25 MG/ML IJ SOLN: 6.2500 mg | INTRAMUSCULAR | Status: DC | PRN

## 2019-06-12 MED ORDER — ACETAMINOPHEN 160 MG/5ML PO SOLN: 1000.0000 mg | Freq: Once | ORAL | Status: DC | PRN

## 2019-06-12 MED ORDER — SUGAMMADEX SODIUM 200 MG/2ML IV SOLN
INTRAVENOUS | Status: DC | PRN
  Administered 2019-06-12: 200 mg via INTRAVENOUS

## 2019-06-12 MED ORDER — ONDANSETRON HCL 4 MG/2ML IJ SOLN
INTRAMUSCULAR | Status: AC
  Filled 2019-06-12: qty 2

## 2019-06-12 MED ORDER — OXYCODONE HCL 5 MG PO TABS: 5.0000 mg | ORAL_TABLET | Freq: Once | ORAL | Status: DC | PRN

## 2019-06-12 MED ORDER — ACETAMINOPHEN 500 MG PO TABS: 1000.0000 mg | ORAL_TABLET | Freq: Three times a day (TID) | ORAL | 1 refills | Status: AC | PRN

## 2019-06-12 MED ORDER — LIDOCAINE 2% (20 MG/ML) 5 ML SYRINGE
INTRAMUSCULAR | Status: AC
  Filled 2019-06-12: qty 5

## 2019-06-12 MED ORDER — BUPIVACAINE HCL 0.5 % IJ SOLN
INTRAMUSCULAR | Status: DC | PRN
  Administered 2019-06-12 (×2): 10 mL

## 2019-06-12 MED ORDER — ONDANSETRON HCL 4 MG/2ML IJ SOLN
INTRAMUSCULAR | Status: DC | PRN
  Administered 2019-06-12: 4 mg via INTRAVENOUS

## 2019-06-12 MED ORDER — MIDAZOLAM HCL 5 MG/5ML IJ SOLN
INTRAMUSCULAR | Status: DC | PRN
  Administered 2019-06-12: 2 mg via INTRAVENOUS

## 2019-06-12 MED ORDER — ARTIFICIAL TEARS OPHTHALMIC OINT
TOPICAL_OINTMENT | OPHTHALMIC | Status: AC
  Filled 2019-06-12: qty 3.5

## 2019-06-12 MED ORDER — LACTATED RINGERS IV SOLN: INTRAVENOUS | Status: DC | PRN

## 2019-06-12 MED ORDER — OXYCODONE HCL 5 MG/5ML PO SOLN: 5.0000 mg | Freq: Once | ORAL | Status: DC | PRN

## 2019-06-12 MED ORDER — PHENYLEPHRINE 40 MCG/ML (10ML) SYRINGE FOR IV PUSH (FOR BLOOD PRESSURE SUPPORT)
PREFILLED_SYRINGE | INTRAVENOUS | Status: AC
  Filled 2019-06-12: qty 10

## 2019-06-12 MED ORDER — ACETAMINOPHEN 500 MG PO TABS: 1000.0000 mg | ORAL_TABLET | Freq: Once | ORAL | Status: DC | PRN

## 2019-06-12 NOTE — Progress Notes (Signed)
Agree with A & P. 

## 2019-06-12 NOTE — Transfer of Care (Signed)
Immediate Anesthesia Transfer of Care Note  Patient: Pamela Reilly  Procedure(s) Performed: DIAGNOSTIC LAPAROSCOPY WITH REMOVAL OF ECTOPIC PREGNANCY (Right ) Laparoscopic Unilateral Salpingectomy (Right )  Patient Location: PACU  Anesthesia Type:General  Level of Consciousness: awake  Airway & Oxygen Therapy: Patient Spontanous Breathing  Post-op Assessment: Report given to RN and Post -op Vital signs reviewed and stable  Post vital signs: Reviewed and stable  Last Vitals:  Vitals Value Taken Time  BP 126/92 06/12/19 0143  Temp    Pulse 88 06/12/19 0144  Resp 17 06/12/19 0144  SpO2 100 % 06/12/19 0144  Vitals shown include unvalidated device data.  Last Pain:  Vitals:   06/11/19 2017  TempSrc:   PainSc: 8          Complications: No apparent anesthesia complications

## 2019-06-12 NOTE — Anesthesia Procedure Notes (Signed)
Procedure Name: Intubation Date/Time: 06/12/2019 12:28 AM Performed by: Claudina Lick, CRNA Pre-anesthesia Checklist: Patient identified, Emergency Drugs available, Suction available, Patient being monitored and Timeout performed Patient Re-evaluated:Patient Re-evaluated prior to induction Oxygen Delivery Method: Circle system utilized Preoxygenation: Pre-oxygenation with 100% oxygen Induction Type: IV induction Ventilation: Mask ventilation without difficulty Laryngoscope Size: Miller and 2 Grade View: Grade I Tube type: Oral Tube size: 7.0 mm Number of attempts: 1 Airway Equipment and Method: Stylet Placement Confirmation: ETT inserted through vocal cords under direct vision,  positive ETCO2 and breath sounds checked- equal and bilateral Secured at: 21 cm Tube secured with: Tape Dental Injury: Teeth and Oropharynx as per pre-operative assessment

## 2019-06-12 NOTE — Discharge Instructions (Signed)
You can go back to work on Monday, June 15, 2019 with lifting restrictions for one month  Laparoscopic Surgery Discharge Instructions  Instructions Following Laparoscopic SurgeryThe following list should answer your most common questions.  Although we will discuss your surgery and post-operative instructions with you prior to your discharge, this list will serve as a reminder if you fail to recall the details of what we discussed.  We will discuss your surgery once again in detail at your post-op visit in two to four weeks. If you haven't already done so, please call to make your appointment as soon as possible.  How you will feel: Although you have just undergone a major surgery, your recovery will be significantly shorter since the surgery was performed through much smaller incisions than the traditional approach.  You should feel slightly better each day.  If you suddenly feel much worse than the prior day, please call the clinic.  It's important during the early part of your recovery that you maintain some activity.  Walking is encouraged.  You will quicken your recovery by continued activity.  Incision:  Your incisions will be closed with dissolvable stitches or surgical adhesive (glue).  There may be Band-aids and/or Steri-strips covering your incisions.  If there is no drainage from the incisions you may remove the Band-aids in one to two days.  You may notice some minor bruising at the incision sites.  This is common and will resolve within several days.  Please inform us if the redness at the edges of your incision appears to be spreading.  If the skin around your incision becomes warm to the touch, or if you notice a pus-like drainage, please call the office.  Stairs/Driving/Activities: You may climb stairs if necessary.  If you've had general anesthesia, do not drive a car the rest of the day today.  You may begin light housework when you feel up to it, but avoid heavy lifting (more than  20-25lbs) or pushing until cleared for these activities by your physician.  Hygiene:  Do not soak your incisions.  Showers are acceptable but you may not take a bath or swim in a pool.  Cleanse your incisions daily with soap and water.  Medications:  Please resume taking any medications that you were taking prior to the surgery.  If we have prescribed any new medications for you, please take them as directed.  Constipation:  It is fairly common to experience some difficulty in moving your bowels following major surgery.  Being active will help to reduce this likelihood. A diet rich in fiber and plenty of liquids is desirable.  If you do become constipated, a mild laxative such as Miralax, Milk of Magnesia, or Metamucil, or a stool softener such as Colace, is recommended.  General Instructions: If you develop a fever of 100.5 degrees or higher, please call the office number(s) below for physician on call.

## 2019-06-12 NOTE — Op Note (Signed)
Operative Note   06/12/2019  PRE-OP DIAGNOSIS *Right tubal ectopic pregnancy *Hemoperitoneum   POST-OP DIAGNOSIS *Same   SURGEON: Surgeon(s) and Role:    [place] Bing, MD - Primary  ASSISTANT: None  PROCEDURE: Laparoscopic right salpingectomy  ANESTHESIA: General and local  ESTIMATED BLOOD LOSS: 45mL for the case approximately of hemoperitoneum  DRAINS: indwelling foley (UOP per anesthesia note)   TOTAL IV FLUIDS: per anesthesia note  VTE PROPHYLAXIS: SCDs to the bilateral lower extremities  ANTIBIOTICS:  Not indicated  SPECIMENS: right fallopian tube  DISPOSITION: PACU - hemodynamically stable.  CONDITION: stable  COMPLICATIONS: None  FINDINGS: EGBUS, vaginal vault and cervix normal. Normal stomach and liver edge, no intra-abdominal adhesions noted. Grossly normal uterus, bilateral ovaries and left fallopian tube. Engorged and vascular with oozing right fallopian tube.   DESCRIPTION OF PROCEDURE: After informed consent was obtained, the patient was taken to the operating room where anesthesia was obtained without difficulty. The patient was positioned in the dorsal lithotomy position in [place] stirrups and her arms were carefully tucked at her sides and the usual precautions were taken.  She was prepped and draped in normal sterile fashion.  Time-out was performed and a Foley catheter was placed into the bladder. A Hulka uterine manipulator was then placed in the uterus without incident. Gloves were then changed, and after injection of local anesthesia, the open technique was used to place an infraumbilical 12-mm baloon trocar under direct visualization. The laparoscope was introduced and CO2 gas was infused for pneumoperitoneum to a pressure of 15 mm Hg and the area below inspected for injury.  The patient was placed in Trendelenburg and the bowel was displaced up into the upper abdomen, and the suprapubic and right lateral 5-mm ports were placed under direct  visualization of the laparoscope, after injection of local anesthesia.    Using the Ligasure device, the right mesosalpinx was serially cauterized and transected and the specimen removed via an endocatch bag. The pressure was placed at and all operative sites were hemostatic. The right lateral and suprapubic ports were removed under direct visualization. Before the umbilical trocar was removed the CO2 gas was released.  The fascia there was closed with 0 vicryl suture in a figure of eight fashion.   The skin incision at the umbilicus was closed with a subcuticular stitch of 4-0 monocryl.  The remaining skin incisions were closed with Dermabond glue.  The patient tolerated the procedure well.  Sponge, lap and needle counts were correct x2.  The patient was taken to recovery room in excellent condition.  Cornelia Copa MD Attending [facility] [employer] [facility])

## 2019-06-15 LAB — SURGICAL PATHOLOGY

## 2019-06-16 NOTE — Anesthesia Postprocedure Evaluation (Signed)
Anesthesia Post Note  Patient: Pamela Reilly  Procedure(s) Performed: DIAGNOSTIC LAPAROSCOPY WITH REMOVAL OF ECTOPIC PREGNANCY (Right ) Laparoscopic Unilateral Salpingectomy (Right )     Patient location during evaluation: PACU Anesthesia Type: General Level of consciousness: awake and alert Pain management: pain level controlled Vital Signs Assessment: post-procedure vital signs reviewed and stable Respiratory status: spontaneous breathing, nonlabored ventilation, respiratory function stable and patient connected to nasal cannula oxygen Cardiovascular status: blood pressure returned to baseline and stable Postop Assessment: no apparent nausea or vomiting Anesthetic complications: no    Last Vitals:  Vitals:   06/12/19 0229 06/12/19 0235  BP: 140/89 139/86  Pulse: 75 73  Resp: 14 11  Temp: 36.4 C 36.4 C  SpO2: 100% 100%    Last Pain:  Vitals:   06/12/19 0235  TempSrc:   PainSc: 3                  Laurisa Sahakian

## 2019-07-09 ENCOUNTER — Telehealth (INDEPENDENT_AMBULATORY_CARE_PROVIDER_SITE_OTHER): Payer: Self-pay | Admitting: Obstetrics and Gynecology

## 2019-07-09 ENCOUNTER — Encounter: Payer: Self-pay | Admitting: Obstetrics and Gynecology

## 2019-07-09 DIAGNOSIS — Z48816 Encounter for surgical aftercare following surgery on the genitourinary system: Secondary | ICD-10-CM

## 2019-07-09 DIAGNOSIS — Z09 Encounter for follow-up examination after completed treatment for conditions other than malignant neoplasm: Secondary | ICD-10-CM

## 2019-07-09 NOTE — Progress Notes (Signed)
I connected with  Pamela Reilly on 07/09/19 at 11:15 AM EST by telephone and verified that I am speaking with the correct person using two identifiers.   I discussed the limitations, risks, security and privacy concerns of performing an evaluation and management service by telephone and the availability of in person appointments. I also discussed with the patient that there may be a patient responsible charge related to this service. The patient expressed understanding and agreed to proceed.  Henrietta Dine, CMA 07/09/2019  11:06 AM

## 2019-07-09 NOTE — Progress Notes (Signed)
   TELEHEALTH VIRTUAL GYNECOLOGY VISIT ENCOUNTER NOTE  I connected with Pamela Reilly on 07/09/19 at 11:15 AM EST by telephone at home and verified that I am speaking with the correct person using two identifiers.   I discussed the limitations, risks, security and privacy concerns of performing an evaluation and management service by telephone and the availability of in person appointments. I also discussed with the patient that there may be a patient responsible charge related to this service. The patient expressed understanding and agreed to proceed.  Chief Complaint: regular post op visit  History:  Pamela Reilly is a 34 y.o. G2P0010 being evaluated today for above CC. She had a 1/29 l/s RS for ectopic pregnancy. She was discharged from the pacu.  Pt is doing well and w/o issue. She says the incisions look great and she hasn't had a period yet. Her periods were about q4-5wks prior to the ectopic.      Past Medical History:  Diagnosis Date  . ADHD (attention deficit hyperactivity disorder)   . Asthma    exercise induced  . Asthma due to seasonal allergies 07/29/2013  . Ovarian cyst    Past Surgical History:  Procedure Laterality Date  . APPENDECTOMY    . BREAST REDUCTION SURGERY    . DIAGNOSTIC LAPAROSCOPY WITH REMOVAL OF ECTOPIC PREGNANCY Right 06/11/2019   Procedure: DIAGNOSTIC LAPAROSCOPY WITH REMOVAL OF ECTOPIC PREGNANCY;  Surgeon: [place] Bing, MD;  Location: [facility] OR;  Service: Gynecology;  Laterality: Right;  . LAPAROSCOPIC UNILATERAL SALPINGECTOMY Right 06/11/2019   Procedure: Laparoscopic Unilateral Salpingectomy;  Surgeon: [place] Bing, MD;  Location: [facility] OR;  Service: Gynecology;  Laterality: Right;  . TONSILLECTOMY  2015   The following portions of the patient's history were reviewed and updated as appropriate: allergies, current medications, past family history, past medical history, past social history, past surgical history and problem list.   Review of Systems:   Pertinent items noted in HPI and remainder of comprehensive ROS otherwise negative.  Physical Exam:   General:  Alert, oriented and cooperative.   Mental Status: Normal mood and affect perceived. Normal judgment and thought content.  Physical exam deferred due to nature of the encounter  Labs and Imaging     Assessment and Plan:     1. Postop check Routine care. Pt okay to come off restrictions. D/w her re: increased risk of rpt ectopic and if becomes pregnant in the future (they want to try again) to let us know asap so can follow her closely. Recommend cointnuing on women's MVI and to wait until period before trying again. If no period in the next few weeks, I told her to take a UPT and let us know either way.   I discussed the assessment and treatment plan with the patient. The patient was provided an opportunity to ask questions and all were answered. The patient agreed with the plan and demonstrated an understanding of the instructions.   The patient was advised to call back or seek an in-person evaluation/go to the ED if the symptoms worsen or if the condition fails to improve as anticipated.  I provided 10 minutes of non-face-to-face time during this encounter. The visit was done via a phone visit.    D[place]ola Bing, MD [facility] [employer], [facility]

## 2019-11-30 ENCOUNTER — Emergency Department (HOSPITAL_COMMUNITY): Payer: Self-pay

## 2019-11-30 ENCOUNTER — Emergency Department (HOSPITAL_COMMUNITY)
Admission: EM | Admit: 2019-11-30 | Discharge: 2019-11-30 | Disposition: A | Discharge status: Home / Self Care | Payer: Self-pay | Attending: Emergency Medicine | Admitting: Emergency Medicine

## 2019-11-30 ENCOUNTER — Other Ambulatory Visit: Payer: Self-pay

## 2019-11-30 ENCOUNTER — Encounter (HOSPITAL_COMMUNITY): Payer: Self-pay

## 2019-11-30 DIAGNOSIS — J45909 Unspecified asthma, uncomplicated: Secondary | ICD-10-CM | POA: Insufficient documentation

## 2019-11-30 DIAGNOSIS — Z79899 Other long term (current) drug therapy: Secondary | ICD-10-CM | POA: Insufficient documentation

## 2019-11-30 DIAGNOSIS — Z87891 Personal history of nicotine dependence: Secondary | ICD-10-CM | POA: Insufficient documentation

## 2019-11-30 DIAGNOSIS — R102 Pelvic and perineal pain: Secondary | ICD-10-CM | POA: Insufficient documentation

## 2019-11-30 DIAGNOSIS — R1031 Right lower quadrant pain: Secondary | ICD-10-CM | POA: Insufficient documentation

## 2019-11-30 DIAGNOSIS — N939 Abnormal uterine and vaginal bleeding, unspecified: Secondary | ICD-10-CM | POA: Insufficient documentation

## 2019-11-30 LAB — SAMPLE TO BLOOD BANK

## 2019-11-30 LAB — CBC
HCT: 38.6 % (ref 36.0–46.0)
Hemoglobin: 12.7 g/dL (ref 12.0–15.0)
MCH: 29.7 pg (ref 26.0–34.0)
MCHC: 32.9 g/dL (ref 30.0–36.0)
MCV: 90.2 fL (ref 80.0–100.0)
Platelets: 211 10*3/uL (ref 150–400)
RBC: 4.28 MIL/uL (ref 3.87–5.11)
RDW: 13.8 % (ref 11.5–15.5)
WBC: 6.2 10*3/uL (ref 4.0–10.5)
nRBC: 0 % (ref 0.0–0.2)

## 2019-11-30 LAB — COMPREHENSIVE METABOLIC PANEL
ALT: 15 U/L (ref 0–44)
AST: 20 U/L (ref 15–41)
Albumin: 3.7 g/dL (ref 3.5–5.0)
Alkaline Phosphatase: 54 U/L (ref 38–126)
Anion gap: 9 (ref 5–15)
BUN: 8 mg/dL (ref 6–20)
CO2: 25 mmol/L (ref 22–32)
Calcium: 9.1 mg/dL (ref 8.9–10.3)
Chloride: 105 mmol/L (ref 98–111)
Creatinine, Ser: 0.75 mg/dL (ref 0.44–1.00)
GFR calc Af Amer: >60 mL/min (ref >60)
GFR calc non Af Amer: >60 mL/min (ref >60)
Glucose, Bld: 88 mg/dL (ref 70–99)
Potassium: 3.3 mmol/L — ABNORMAL LOW (ref 3.5–5.1)
Sodium: 139 mmol/L (ref 135–145)
Total Bilirubin: 0.8 mg/dL (ref 0.3–1.2)
Total Protein: 6.6 g/dL (ref 6.5–8.1)

## 2019-11-30 LAB — URINALYSIS, ROUTINE W REFLEX MICROSCOPIC
Bacteria, UA: NONE SEEN
Bilirubin Urine: NEGATIVE
Glucose, UA: NEGATIVE mg/dL
Ketones, ur: NEGATIVE mg/dL
Leukocytes,Ua: NEGATIVE
Nitrite: NEGATIVE
Protein, ur: NEGATIVE mg/dL
Specific Gravity, Urine: 1.01 (ref 1.005–1.030)
pH: 6 (ref 5.0–8.0)

## 2019-11-30 LAB — PREGNANCY, URINE: Preg Test, Ur: NEGATIVE

## 2019-11-30 LAB — LIPASE, BLOOD: Lipase: 24 U/L (ref 11–51)

## 2019-11-30 LAB — I-STAT BETA HCG BLOOD, ED (MC, WL, AP ONLY): I-stat hCG, quantitative: <5 m[IU]/mL (ref <5)

## 2019-11-30 LAB — WET PREP, GENITAL
Clue Cells Wet Prep HPF POC: NONE SEEN
Sperm: NONE SEEN
Trich, Wet Prep: NONE SEEN
Yeast Wet Prep HPF POC: NONE SEEN

## 2019-11-30 MED ORDER — OXYCODONE-ACETAMINOPHEN 5-325 MG PO TABS
1.0000 | ORAL_TABLET | Freq: Once | ORAL | Status: AC
  Administered 2019-11-30: 1 via ORAL
  Filled 2019-11-30: qty 1

## 2019-11-30 MED ORDER — IBUPROFEN 400 MG PO TABS
600.0000 mg | ORAL_TABLET | Freq: Once | ORAL | Status: AC
  Administered 2019-11-30: 600 mg via ORAL
  Filled 2019-11-30: qty 1

## 2019-11-30 NOTE — ED Notes (Signed)
Report to [facility] in [facility]

## 2019-11-30 NOTE — ED Triage Notes (Signed)
Pt arrives POV for eval of sharp RLQ abd pain x 3 days w/ vag bleeding onset last night. Pt reports pos preg test 5 days PTA. Hx of ectopic in Jan 2021, states this feels similar. Endorses dizziness on standing.

## 2019-11-30 NOTE — ED Provider Notes (Signed)
MSE was initiated and I personally evaluated the patient and placed orders (if any) at  12:29 PM on November 30, 2019.  The patient appears stable so that the remainder of the MSE may be completed by another provider.  34 year old female who presents for evaluation of right lower quadrant abdominal pain x3 days and vaginal bleeding.  She reports history of ectopic pregnancy in January 2021.  She reports that the vaginal bleeding has gotten worse and she is saturating a pad every hour or so.  She also has felt like the pain in her abdomen is gotten worse.  She has had some nausea but no vomiting.  She has not noted any fevers.  He took an at home pregnancy test that was positive.  Abdomen: Soft, nondistended.  Tenderness palpation of right lower quadrant and suprapubic region.  No rigidity, guarding.  No palpable hernia.  Discussed with Erin (MAU APP) who accepts patient for transfer after positive pregnancy test.  Portions of this note were generated with Dragon dictation software. Dictation errors may occur despite best attempts at proofreading.      Maxwell Caul, PA-C 11/30/19 1307    Little, Ambrose Finland, MD 11/30/19 1319

## 2019-11-30 NOTE — ED Provider Notes (Signed)
[facility] EMERGENCY DEPARTMENT Provider Note   CSN: [id number] Arrival date & time: 11/30/19  1202     History Chief Complaint  Patient presents with  . Vaginal Bleeding  . Abdominal Pain    Pamela Reilly is a 34 y.o. female.  The history is provided by the patient and medical records. No language interpreter was used.  Vaginal Bleeding Associated symptoms: abdominal pain   Abdominal Pain Associated symptoms: vaginal bleeding    Pamela Reilly is a 34 y.o. female who presents to the Emergency Department complaining of fashionable eating and abdominal pain. She presents the emergency department complaining of fragile bleeding that began three days ago. Five days ago she took a home pregnancy test and it was positive. She took a second pregnancy test and it was negative. She reports that her bleeding began as a gush of blood and was a large amount. She had heavy bleeding this morning but now it is light. She has a history of irregular menstrual cycles in her last cycle was on June 12. She had an ectopic pregnancy in January of this year and had to have fallopian tube removed and she is concerned for recurrent ectopic pregnancy. She feels lightheaded and uneasy and has right lower quadrant abdominal pain. Symptoms are severe and waxing and waning.     Past Medical History:  Diagnosis Date  . ADHD (attention deficit hyperactivity disorder)   . Asthma    exercise induced  . Asthma due to seasonal allergies 07/29/2013  . Ovarian cyst     Patient Active Problem List   Diagnosis Date Noted  . Ectopic pregnancy of right ovary 06/01/2019  . Medication management 01/03/2015  . Vitamin D deficiency 01/03/2015  . Asthma due to seasonal allergies 07/29/2013  . ADD (attention deficit disorder) 03/31/2013    Past Surgical History:  Procedure Laterality Date  . APPENDECTOMY    . BREAST REDUCTION SURGERY    . DIAGNOSTIC LAPAROSCOPY WITH REMOVAL OF ECTOPIC PREGNANCY  Right 06/11/2019   Procedure: DIAGNOSTIC LAPAROSCOPY WITH REMOVAL OF ECTOPIC PREGNANCY;  Surgeon: [place] Bing, MD;  Location: [facility] OR;  Service: Gynecology;  Laterality: Right;  . LAPAROSCOPIC UNILATERAL SALPINGECTOMY Right 06/11/2019   Procedure: Laparoscopic Unilateral Salpingectomy;  Surgeon:  Bing, MD;  Location: [facility] OR;  Service: Gynecology;  Laterality: Right;  . TONSILLECTOMY  2015     OB History    Gravida  2   Para      Term      Preterm      AB  1   Living        SAB      TAB  1   Ectopic      Multiple      Live Births              Family History  Problem Relation Age of Onset  . Diabetes Mother   . Hypertension Mother   . Hypertension Father   . Cancer Father     Social History   Tobacco Use  . Smoking status: Former Smoker    Types: Cigarettes  . Smokeless tobacco: Never Used  . Tobacco comment: was recreationally when younger  Vaping Use  . Vaping Use: Never used  Substance Use Topics  . Alcohol use: Not Currently    Comment: social  . Drug use: Never    Home Medications Prior to Admission medications   Medication Sig Start Date End Date Taking? Authorizing Provider  acetaminophen (TYLENOL) 500 MG tablet Take 2 tablets (1,000 mg total) by mouth every 8 (eight) hours as needed. Patient taking differently: Take 1,000 mg by mouth every 8 (eight) hours as needed for headache (pain).  06/12/19  Yes Brandt Bing, MD  albuterol (PROVENTIL HFA;VENTOLIN HFA) 108 (90 Base) MCG/ACT inhaler Inhale 2 puffs into the lungs every 6 (six) hours as needed for wheezing or shortness of breath. Patient taking differently: Inhale 2 puffs into the lungs every 6 (six) hours as needed for wheezing or shortness of breath (exercise induced asthma).  07/12/16  Yes Quentin Mulling, PA-C  amphetamine-dextroamphetamine (ADDERALL) 30 MG tablet Take 7.5 mg by mouth 4 (four) times a week.  11/26/19  Yes [provider]  traZODone (DESYREL) 50 MG  tablet Take 50 mg by mouth at bedtime as needed for sleep.  09/24/19  Yes [provider]    Allergies    Patient has no known allergies.  Review of Systems   Review of Systems  Gastrointestinal: Positive for abdominal pain.  Genitourinary: Positive for vaginal bleeding.  All other systems reviewed and are negative.   Physical Exam Updated Vital Signs BP 140/90 (BP Location: Right Arm)   Pulse 100   Temp 98.3 F (36.8 C) (Oral)   Resp 18   Ht 5\' 6"  (1.676 m)   Wt 72.6 kg   LMP 04/17/2019   SpO2 100%   BMI 25.82 kg/m   Physical Exam Vitals and nursing note reviewed.  Constitutional:      Appearance: She is well-developed.  HENT:     Head: Normocephalic and atraumatic.  Cardiovascular:     Rate and Rhythm: Normal rate and regular rhythm.     Heart sounds: No murmur heard.   Pulmonary:     Effort: Pulmonary effort is normal. No respiratory distress.     Breath sounds: Normal breath sounds.  Abdominal:     Palpations: Abdomen is soft.     Tenderness: There is no guarding or rebound.     Comments: Mild right lower quadrant tenderness  Genitourinary:    Comments: Scant vaginal bleeding. Os closed. No CMT. Musculoskeletal:        General: No tenderness.  Skin:    General: Skin is warm and dry.  Neurological:     Mental Status: She is alert and oriented to person, place, and time.  Psychiatric:        Behavior: Behavior normal.     ED Results / Procedures / Treatments   Labs (all labs ordered are listed, but only abnormal results are displayed) Labs Reviewed  WET PREP, GENITAL - Abnormal; Notable for the following components:      Result Value   WBC, Wet Prep HPF POC FEW (*)    All other components within normal limits  COMPREHENSIVE METABOLIC PANEL - Abnormal; Notable for the following components:   Potassium 3.3 (*)    All other components within normal limits  URINALYSIS, ROUTINE W REFLEX MICROSCOPIC - Abnormal; Notable for the following  components:   Hgb urine dipstick LARGE (*)    All other components within normal limits  LIPASE, BLOOD  CBC  PREGNANCY, URINE  I-STAT BETA HCG BLOOD, ED ([facility], [facility], [facility] ONLY)  SAMPLE TO BLOOD BANK  GC/CHLAMYDIA PROBE AMP ([place]) NOT AT [facility]    EKG None  Radiology [facility] PELVIC COMPLETE W TRANSVAGINAL AND TORSION R/O  Result Date: 11/30/2019 CLINICAL DATA:  Right lower quadrant abdominal pain, vaginal bleeding, history of right salpingectomy  EXAM: TRANSABDOMINAL AND TRANSVAGINAL ULTRASOUND OF PELVIS DOPPLER ULTRASOUND OF OVARIES TECHNIQUE: Both transabdominal and transvaginal ultrasound examinations of the pelvis were performed. Transabdominal technique was performed for global imaging of the pelvis including uterus, ovaries, adnexal regions, and pelvic cul-de-sac. It was necessary to proceed with endovaginal exam following the transabdominal exam to visualize the the ovaries bilaterally. Color and duplex Doppler ultrasound was utilized to evaluate blood flow to the ovaries. COMPARISON:  None. FINDINGS: Uterus Measurements: 8.4 x 4.3 x 5.6 cm = volume: 105 mL. No fibroids or other mass visualized. Endometrium Thickness: 3 mm.  No focal abnormality visualized. Right ovary Measurements: 3.6 x 1.6 x 2.3 cm = volume: 7 mL. Normal appearance/no adnexal mass. Left ovary Measurements: 2.7 x 2.5 x 2.0 cm = volume: 7 mL. Normal appearance/no adnexal mass. Pulsed Doppler evaluation of both ovaries demonstrates normal low-resistance arterial and venous waveforms. Other findings No abnormal free fluid. IMPRESSION: Normal pelvic sonogram.  Normal ovarian vascularity. Electronically Signed   By: Helyn Numbers MD   On: 11/30/2019 21:48    Procedures Procedures (including critical care time)  Medications Ordered in ED Medications  ibuprofen (ADVIL) tablet 600 mg (600 mg Oral Given 11/30/19 2015)  oxyCODONE-acetaminophen (PERCOCET/ROXICET) 5-325 MG per tablet 1 tablet (1 tablet Oral Given 11/30/19 2314)     ED Course  I have reviewed the triage vital signs and the nursing notes.  Pertinent labs & imaging results that were available during my care of the patient were reviewed by me and considered in my medical decision making (see chart for details).    MDM Rules/Calculators/A&P                         Patient with history of ectopic pregnancy here for evaluation of pelvic pain, vaginal bleeding. Pelvic examination with small amount of bleeding, no evidence of hemorrhage. Pelvic ultrasound is negative for torsion, ruptured cyst. Current presentation is not consistent with ectopic pregnancy in setting of negative pregnancy test is negative ultrasound. Discussed with patient home care for pelvic pain. Recommend gynecology follow-up. Return precautions discussed. Presentation is not consistent with appendicitis.  Final Clinical Impression(s) / ED Diagnoses Final diagnoses:  Vaginal bleeding    Rx / DC Orders ED Discharge Orders    None       Tilden Fossa, MD 12/01/19 [phone]

## 2019-11-30 NOTE — ED Notes (Signed)
Called pt name 3 times for VS, no answer

## 2019-12-01 LAB — GC/CHLAMYDIA PROBE AMP (~~LOC~~) NOT AT ARMC
Chlamydia: NEGATIVE
Comment: NEGATIVE
Comment: NORMAL
Neisseria Gonorrhea: NEGATIVE

## 2020-02-06 ENCOUNTER — Emergency Department (HOSPITAL_COMMUNITY)
Admission: EM | Admit: 2020-02-06 | Discharge: 2020-02-06 | Disposition: A | Discharge status: Home / Self Care | Payer: Self-pay | Attending: Emergency Medicine | Admitting: Emergency Medicine

## 2020-02-06 ENCOUNTER — Encounter (HOSPITAL_BASED_OUTPATIENT_CLINIC_OR_DEPARTMENT_OTHER): Payer: Self-pay | Admitting: *Deleted

## 2020-02-06 ENCOUNTER — Emergency Department (HOSPITAL_BASED_OUTPATIENT_CLINIC_OR_DEPARTMENT_OTHER): Payer: Self-pay

## 2020-02-06 ENCOUNTER — Emergency Department (HOSPITAL_BASED_OUTPATIENT_CLINIC_OR_DEPARTMENT_OTHER)
Admission: EM | Admit: 2020-02-06 | Discharge: 2020-02-06 | Disposition: A | Discharge status: Home / Self Care | Payer: Self-pay | Attending: Emergency Medicine | Admitting: Emergency Medicine

## 2020-02-06 ENCOUNTER — Other Ambulatory Visit: Payer: Self-pay

## 2020-02-06 DIAGNOSIS — Z5321 Procedure and treatment not carried out due to patient leaving prior to being seen by health care provider: Secondary | ICD-10-CM | POA: Insufficient documentation

## 2020-02-06 DIAGNOSIS — S61210A Laceration without foreign body of right index finger without damage to nail, initial encounter: Secondary | ICD-10-CM | POA: Insufficient documentation

## 2020-02-06 DIAGNOSIS — W25XXXA Contact with sharp glass, initial encounter: Secondary | ICD-10-CM | POA: Insufficient documentation

## 2020-02-06 DIAGNOSIS — S61217A Laceration without foreign body of left little finger without damage to nail, initial encounter: Secondary | ICD-10-CM | POA: Insufficient documentation

## 2020-02-06 NOTE — ED Notes (Signed)
Pt was seen walking out after using the phone and has not returned. Pt did not respond for vitals check

## 2020-02-06 NOTE — ED Triage Notes (Signed)
Pt has a laceration to the right pointer finger. Pt just had xrays at med center and left. Pt is intoxicated.

## 2020-02-06 NOTE — ED Notes (Signed)
Security asked to come to pt's room to ask her not to yell obscene language.

## 2020-02-06 NOTE — ED Notes (Signed)
Pt walked out of room and stated " I am leaving, you guys are bitches". MD aware.

## 2020-02-06 NOTE — ED Notes (Signed)
PT coming out of her room, without mask. Pt told her husband" fiance" to leave, but now asking for another visitor. PT informed of COVID visitor restrictions. Pt states " You don't care about me, where is the doctor, Im hurting and no one cares". Pt crying, and stating she needs a doctor. Pt intoxicated, smells of etoh. Pt asked to stay in room, wear mask, and MD will see her as soon.

## 2020-02-06 NOTE — ED Notes (Signed)
Pt wouldn't let this tech wrap her finger. States "you are just pushing glass further in my finger thank you so much." left before I could wrap her fingers up.

## 2020-02-06 NOTE — ED Triage Notes (Addendum)
C/o lac to right index finger by glass x 30 mins ago , lac to left left 5th finger . ETOH

## 2020-02-06 NOTE — ED Notes (Signed)
PT very intoxicated, yelling " get the doctor right now". Pt's hands cleaned, laceration to right index finger. Pt flailing hands in the air. Pt screaming in the room, and crying. Pt instructed to try to keep hands still. Friend at bedside. No active bleeding. Pt screaming " This fucking hurts" ".

## 2020-10-18 ENCOUNTER — Inpatient Hospital Stay (HOSPITAL_COMMUNITY): Payer: Self-pay

## 2020-10-18 ENCOUNTER — Inpatient Hospital Stay (HOSPITAL_COMMUNITY)
Admission: AD | Admit: 2020-10-18 | Discharge: 2020-10-19 | Disposition: A | Discharge status: Home / Self Care | Payer: Self-pay | Attending: Obstetrics and Gynecology | Admitting: Obstetrics and Gynecology

## 2020-10-18 ENCOUNTER — Other Ambulatory Visit: Payer: Self-pay

## 2020-10-18 ENCOUNTER — Encounter (HOSPITAL_COMMUNITY): Payer: Self-pay | Admitting: Obstetrics and Gynecology

## 2020-10-18 DIAGNOSIS — O3680X0 Pregnancy with inconclusive fetal viability, not applicable or unspecified: Secondary | ICD-10-CM

## 2020-10-18 DIAGNOSIS — O209 Hemorrhage in early pregnancy, unspecified: Secondary | ICD-10-CM | POA: Insufficient documentation

## 2020-10-18 DIAGNOSIS — Z79899 Other long term (current) drug therapy: Secondary | ICD-10-CM | POA: Insufficient documentation

## 2020-10-18 DIAGNOSIS — Z3A01 Less than 8 weeks gestation of pregnancy: Secondary | ICD-10-CM | POA: Insufficient documentation

## 2020-10-18 DIAGNOSIS — Z87891 Personal history of nicotine dependence: Secondary | ICD-10-CM | POA: Insufficient documentation

## 2020-10-18 LAB — URINALYSIS, ROUTINE W REFLEX MICROSCOPIC
Bacteria, UA: NONE SEEN
Bilirubin Urine: NEGATIVE
Glucose, UA: NEGATIVE mg/dL
Ketones, ur: NEGATIVE mg/dL
Leukocytes,Ua: NEGATIVE
Nitrite: NEGATIVE
Protein, ur: NEGATIVE mg/dL
Specific Gravity, Urine: 1.006 (ref 1.005–1.030)
pH: 6 (ref 5.0–8.0)

## 2020-10-18 LAB — CBC
HCT: 38.4 % (ref 36.0–46.0)
Hemoglobin: 13 g/dL (ref 12.0–15.0)
MCH: 30.2 pg (ref 26.0–34.0)
MCHC: 33.9 g/dL (ref 30.0–36.0)
MCV: 89.1 fL (ref 80.0–100.0)
Platelets: 240 10*3/uL (ref 150–400)
RBC: 4.31 MIL/uL (ref 3.87–5.11)
RDW: 14 % (ref 11.5–15.5)
WBC: 8.7 10*3/uL (ref 4.0–10.5)
nRBC: 0 % (ref 0.0–0.2)

## 2020-10-18 LAB — HIV ANTIBODY (ROUTINE TESTING W REFLEX): HIV Screen 4th Generation wRfx: NONREACTIVE

## 2020-10-18 LAB — WET PREP, GENITAL
Clue Cells Wet Prep HPF POC: NONE SEEN
Trich, Wet Prep: NONE SEEN
Yeast Wet Prep HPF POC: NONE SEEN

## 2020-10-18 LAB — HCG, QUANTITATIVE, PREGNANCY: hCG, Beta Chain, Quant, S: 2529 m[IU]/mL — ABNORMAL HIGH (ref <5)

## 2020-10-18 LAB — POCT PREGNANCY, URINE: Preg Test, Ur: POSITIVE — AB

## 2020-10-18 MED ORDER — HYDROXYZINE HCL 25 MG PO TABS
25.0000 mg | ORAL_TABLET | Freq: Once | ORAL | Status: AC
  Administered 2020-10-18: 25 mg via ORAL
  Filled 2020-10-18: qty 1

## 2020-10-18 NOTE — MAU Provider Note (Signed)
Chief Complaint: Vaginal Bleeding   Event Date/Time   First Provider Initiated Contact with Patient 10/18/20 2150        SUBJECTIVE HPI: Pamela Reilly is a 35 y.o. G3P0020 at [redacted]w[redacted]d by LMP who presents to maternity admissions reporting vaginal bleeding.  States it started today with some mild cramping. History of ruptured right ectopic pregnancy last year. She denies vaginal itching/burning, urinary symptoms, h/a, dizziness, n/v, or fever/chills.    Vaginal Bleeding The patient's primary symptoms include pelvic pain (mild cramping) and vaginal bleeding. The patient's pertinent negatives include no genital itching, genital lesions or genital odor. This is a new problem. The current episode started today. The problem has been unchanged. The problem affects both sides. She is pregnant. Pertinent negatives include no chills, constipation, diarrhea, fever, frequency, headaches, nausea or vomiting. The vaginal discharge was bloody. The vaginal bleeding is typical of menses. She has not been passing clots. She has not been passing tissue. Nothing aggravates the symptoms. She has tried nothing for the symptoms.   RN Note: Pt reports she had a positive HPT on Sunday. Stared having some vag bleeding today with some cramping. Also c/o feel cold chills and fatigue. Had a ruptured ectopic last year.  Past Medical History:  Diagnosis Date  . ADHD (attention deficit hyperactivity disorder)   . Asthma    exercise induced  . Asthma due to seasonal allergies 07/29/2013  . Ovarian cyst    Past Surgical History:  Procedure Laterality Date  . APPENDECTOMY    . BREAST REDUCTION SURGERY    . DIAGNOSTIC LAPAROSCOPY WITH REMOVAL OF ECTOPIC PREGNANCY Right 06/11/2019   Procedure: DIAGNOSTIC LAPAROSCOPY WITH REMOVAL OF ECTOPIC PREGNANCY;  Surgeon: Bouton Bing, MD;  Location: [facility] OR;  Service: Gynecology;  Laterality: Right;  . LAPAROSCOPIC UNILATERAL SALPINGECTOMY Right 06/11/2019   Procedure: Laparoscopic  Unilateral Salpingectomy;  Surgeon: Weedpatch Bing, MD;  Location: [facility] OR;  Service: Gynecology;  Laterality: Right;  . TONSILLECTOMY  2015   Social History   Socioeconomic History  . Marital status: Single    Spouse name: Not on file  . Number of children: Not on file  . Years of education: Not on file  . Highest education level: Not on file  Occupational History  . Not on file  Tobacco Use  . Smoking status: Former Smoker    Types: Cigarettes  . Smokeless tobacco: Never Used  . Tobacco comment: was recreationally when younger  Vaping Use  . Vaping Use: Never used  Substance and Sexual Activity  . Alcohol use: Not Currently    Comment: social  . Drug use: Never  . Sexual activity: Yes    Birth control/protection: None  Other Topics Concern  . Not on file  Social History Narrative  . Not on file   Social Determinants of Health   Financial Resource Strain: Not on file  Food Insecurity: Not on file  Transportation Needs: Not on file  Physical Activity: Not on file  Stress: Not on file  Social Connections: Not on file  Intimate Partner Violence: Not on file   No current facility-administered medications on file prior to encounter.   Current Outpatient Medications on File Prior to Encounter  Medication Sig Dispense Refill  . amphetamine-dextroamphetamine (ADDERALL) 30 MG tablet Take 7.5 mg by mouth 4 (four) times a week.     . traZODone (DESYREL) 50 MG tablet Take 50 mg by mouth at bedtime as needed for sleep.     Marland Kitchen acetaminophen (TYLENOL) 500  MG tablet Take 2 tablets (1,000 mg total) by mouth every 8 (eight) hours as needed. (Patient taking differently: Take 1,000 mg by mouth every 8 (eight) hours as needed for headache (pain). ) 45 tablet 1  . albuterol (PROVENTIL HFA;VENTOLIN HFA) 108 (90 Base) MCG/ACT inhaler Inhale 2 puffs into the lungs every 6 (six) hours as needed for wheezing or shortness of breath. (Patient taking differently: Inhale 2 puffs into the lungs every  6 (six) hours as needed for wheezing or shortness of breath (exercise induced asthma). ) 1 Inhaler 2   No Known Allergies  I have reviewed patient's Past Medical Hx, Surgical Hx, Family Hx, Social Hx, medications and allergies.   ROS:  Review of Systems  Constitutional: Negative for chills and fever.  Gastrointestinal: Negative for constipation, diarrhea, nausea and vomiting.  Genitourinary: Positive for pelvic pain (mild cramping) and vaginal bleeding. Negative for frequency.  Neurological: Negative for headaches.   Review of Systems  Other systems negative   Physical Exam  Physical Exam Patient Vitals for the past 24 hrs:  BP Temp Pulse Resp Height Weight  10/18/20 2130 136/84 -- 86 -- -- --  10/18/20 2115 (!) 145/94 -- 88 -- -- --  10/18/20 2108 (!) 159/97 -- 99 -- -- --  10/18/20 2055 (!) 158/99 98.7 F (37.1 C) 90 18 5\' 6"  (1.676 m) 70.8 kg   Constitutional: Well-developed, well-nourished female in no acute distress.  Cardiovascular: normal rate Respiratory: normal effort GI: Abd soft, non-tender. Pos BS x 4 MS: Extremities nontender, no edema, normal ROM Neurologic: Alert and oriented x 4.  GU: Neg CVAT.  PELVIC EXAM: Cervix pink, visually closed, without lesion, moderate bloody discharge, vaginal walls and external genitalia normal Bimanual exam: Cervix 0/long/high, firm, anterior, neg CMT, uterus nontender,  Slightly enlarged, adnexa without tenderness, enlargement, or mass   LAB RESULTS Results for orders placed or performed during the hospital encounter of 10/18/20 (from the past 24 hour(s))  Pregnancy, urine POC     Status: Abnormal   Collection Time: 10/18/20  8:48 PM  Result Value Ref Range   Preg Test, Ur POSITIVE (A) NEGATIVE  Urinalysis, Routine w reflex microscopic Urine, Clean Catch     Status: Abnormal   Collection Time: 10/18/20  8:59 PM  Result Value Ref Range   Color, Urine STRAW (A) YELLOW   APPearance CLEAR CLEAR   Specific Gravity, Urine  1.006 1.005 - 1.030   pH 6.0 5.0 - 8.0   Glucose, UA NEGATIVE NEGATIVE mg/dL   Hgb urine dipstick MODERATE (A) NEGATIVE   Bilirubin Urine NEGATIVE NEGATIVE   Ketones, ur NEGATIVE NEGATIVE mg/dL   Protein, ur NEGATIVE NEGATIVE mg/dL   Nitrite NEGATIVE NEGATIVE   Leukocytes,Ua NEGATIVE NEGATIVE   RBC / HPF 0-5 0 - 5 RBC/hpf   WBC, UA 0-5 0 - 5 WBC/hpf   Bacteria, UA NONE SEEN NONE SEEN   Squamous Epithelial / LPF 0-5 0 - 5   Mucus PRESENT   Wet prep, genital     Status: Abnormal   Collection Time: 10/18/20  9:35 PM  Result Value Ref Range   Yeast Wet Prep HPF POC NONE SEEN NONE SEEN   Trich, Wet Prep NONE SEEN NONE SEEN   Clue Cells Wet Prep HPF POC NONE SEEN NONE SEEN   WBC, Wet Prep HPF POC MODERATE (A) NONE SEEN   Sperm PRESENT   CBC     Status: None   Collection Time: 10/18/20  9:45 PM  Result Value  Ref Range   WBC 8.7 4.0 - 10.5 K/uL   RBC 4.31 3.87 - 5.11 MIL/uL   Hemoglobin 13.0 12.0 - 15.0 g/dL   HCT 31.5 40.0 - 86.7 %   MCV 89.1 80.0 - 100.0 fL   MCH 30.2 26.0 - 34.0 pg   MCHC 33.9 30.0 - 36.0 g/dL   RDW 61.9 50.9 - 32.6 %   Platelets 240 150 - 400 K/uL   nRBC 0.0 0.0 - 0.2 %  hCG, quantitative, pregnancy     Status: Abnormal   Collection Time: 10/18/20  9:45 PM  Result Value Ref Range   hCG, Beta Chain, Quant, S 2,529 (H) <5 mIU/mL  HIV Antibody (routine testing w rflx)     Status: None   Collection Time: 10/18/20  9:45 PM  Result Value Ref Range   HIV Screen 4th Generation wRfx Non Reactive Non Reactive     IMAGING US OB LESS THAN 14 WEEKS WITH OB TRANSVAGINAL  Result Date: 10/18/2020 CLINICAL DATA:  Vaginal bleeding. EXAM: OBSTETRIC <14 WK [place] AND TRANSVAGINAL OB US TECHNIQUE: Both transabdominal and transvaginal ultrasound examinations were performed for complete evaluation of the gestation as well as the maternal uterus, adnexal regions, and pelvic cul-de-sac. Transvaginal technique was performed to assess early pregnancy. COMPARISON:  None. FINDINGS:  Intrauterine gestational sac: None Yolk sac:  Not Visualized. Embryo:  Not Visualized. Cardiac Activity: Not Visualized. Heart Rate: N/A  bpm Subchorionic hemorrhage:  None visualized. Maternal uterus/adnexae: The right ovary is visualized and is normal in appearance. A corpus luteum cyst is seen within the otherwise normal appearing left ovary. A trace amount of pelvic free fluid is seen. IMPRESSION: No evidence of an intrauterine pregnancy. Correlation with follow-up pelvic ultrasound and serial beta HCG levels is recommended if this remains of clinical concern. Electronically Signed   By: Aram Candela M.D.   On: 10/18/2020 22:13    MAU Management/MDM: Ordered usual first trimester r/o ectopic labs.   Pelvic exam and cultures done Will check baseline Ultrasound to rule out ectopic.  This bleeding/pain can represent a normal pregnancy with bleeding, spontaneous abortion or even an ectopic which can be life-threatening.  The process as listed above helps to determine which of these is present.  Reviewed results Given early gestational age, cannot determine course of pregnancy yet Discussed next step would be to repeat HCG in 48 hrs then repeat US 7-10 days Patient prefers Friday after lunch for repeat HCG  Expressed feelings of anxiety while here Requested Desyrel, but I did not feel comfortable giving that Vistaril 50mg  given PO for anxiety  ASSESSMENT 1. Bleeding in early pregnancy   2. Pregnancy of unknown anatomic location     PLAN Discharge home Plan to repeat HCG level in 48 hours in clinic Will repeat  Ultrasound in about 7-10 days if HCG levels double appropriately  Ectopic precautions  Pt stable at time of discharge. Encouraged to return here if she develops worsening of symptoms, increase in pain, fever, or other concerning symptoms.    CNM, MSN Certified Nurse-Midwife 10/18/2020  9:50 PM

## 2020-10-18 NOTE — MAU Note (Signed)
Pt reports she had a positive HPT on Sunday. Stared having some vag bleeding today with some cramping. Also c/o feel cold chills and fatigue. Had a ruptured ectopic last year.

## 2020-10-18 NOTE — Discharge Instructions (Signed)
Vaginal Bleeding During Pregnancy, First Trimester A small amount of bleeding from the vagina, or spotting, is common during early pregnancy. Some bleeding may be related to the pregnancy, and some may not. In many cases, the bleeding is normal and is not a problem. However, bleeding can also be a sign of something serious. Normal things that may cause bleeding during the first trimester:  Implantation of the fertilized egg in the lining of the uterus.  Rapid changes in blood vessels. This is caused by changes that are happening to the body during pregnancy.  Sex.  Pelvic exams. Abnormal things that may cause bleeding during the first trimester include:  Infection or inflammation of the cervix.  Growths or polyps on the cervix.  Miscarriage or threatened miscarriage.  Pregnancy that is growing outside of the uterus (ectopic pregnancy).  A fertilized egg that becomes a mass of tissue (molar pregnancy). Tell your health care provider right away if there is any bleeding from your vagina. Follow these instructions at home: Monitoring your bleeding Monitor your bleeding.  Pay attention to any changes in your symptoms. Let your health care provider know about any concerns.  Try to understand when the bleeding occurs. Does the bleeding start on its own, or does it start after something is done, such as sex or a pelvic exam?  Use a diary to record the things you see about your bleeding, including: ? The kind of bleeding you are having. Does the bleeding start and stop irregularly, or is it a constant flow? ? The severity of your bleeding. Is the bleeding heavy or light? ? The number of pads you use each day, how often you change them, and how soaked they are.  Tell your health care provider if you pass tissue. He or she may want to see it.   Activity  Follow instructions from your health care provider about limiting your activity. Ask what activities are safe for you.  Do not have  sex until your health care provider says that this is safe.  If needed, make plans for someone to help with your regular activities. General instructions  Take over-the-counter and prescription medicines only as told by your health care provider.  Do not take aspirin because it can cause bleeding.  Do not use tampons or douche.  Keep all follow-up visits. This is important. Contact a health care provider if:  You have vaginal bleeding during any part of your pregnancy.  You have cramps or labor pains.  You have a fever or chills. Get help right away if:  You have severe cramps in your back or abdomen.  You pass large clots or a large amount of tissue from your vagina.  Your bleeding increases.  You feel light-headed or weak, or you faint.  You are leaking fluid or have a gush of fluid from your vagina. Summary  A small amount of bleeding from the vagina is common during early pregnancy.  Be sure to tell your health care provider about any vaginal bleeding right away.  Try to understand when bleeding occurs. Does bleeding occur on its own, or does it occur after something is done, such as sex or pelvic exams?  Keep all follow-up visits. This is important. This information is not intended to replace advice given to you by your health care provider. Make sure you discuss any questions you have with your health care provider. Document Revised: 01/21/2020 Document Reviewed: 01/21/2020 Elsevier Patient Education  2021 Elsevier Inc.  

## 2020-10-19 ENCOUNTER — Other Ambulatory Visit: Payer: Self-pay

## 2020-10-19 ENCOUNTER — Encounter (HOSPITAL_COMMUNITY): Payer: Self-pay | Admitting: Obstetrics & Gynecology

## 2020-10-19 ENCOUNTER — Inpatient Hospital Stay (HOSPITAL_COMMUNITY)
Admission: AD | Admit: 2020-10-19 | Discharge: 2020-10-19 | Disposition: A | Discharge status: Home / Self Care | Payer: Self-pay | Attending: Obstetrics & Gynecology | Admitting: Obstetrics & Gynecology

## 2020-10-19 DIAGNOSIS — Z3A01 Less than 8 weeks gestation of pregnancy: Secondary | ICD-10-CM | POA: Insufficient documentation

## 2020-10-19 DIAGNOSIS — O3680X0 Pregnancy with inconclusive fetal viability, not applicable or unspecified: Secondary | ICD-10-CM

## 2020-10-19 DIAGNOSIS — M549 Dorsalgia, unspecified: Secondary | ICD-10-CM | POA: Insufficient documentation

## 2020-10-19 DIAGNOSIS — R102 Pelvic and perineal pain: Secondary | ICD-10-CM | POA: Insufficient documentation

## 2020-10-19 DIAGNOSIS — Z87891 Personal history of nicotine dependence: Secondary | ICD-10-CM | POA: Insufficient documentation

## 2020-10-19 DIAGNOSIS — R1030 Lower abdominal pain, unspecified: Secondary | ICD-10-CM | POA: Insufficient documentation

## 2020-10-19 DIAGNOSIS — O09291 Supervision of pregnancy with other poor reproductive or obstetric history, first trimester: Secondary | ICD-10-CM | POA: Insufficient documentation

## 2020-10-19 DIAGNOSIS — O26891 Other specified pregnancy related conditions, first trimester: Secondary | ICD-10-CM | POA: Insufficient documentation

## 2020-10-19 LAB — GC/CHLAMYDIA PROBE AMP (~~LOC~~) NOT AT ARMC
Chlamydia: NEGATIVE
Comment: NEGATIVE
Comment: NORMAL
Neisseria Gonorrhea: NEGATIVE

## 2020-10-19 MED ORDER — HYDROXYZINE HCL 25 MG PO TABS: 25.0000 mg | ORAL_TABLET | Freq: Four times a day (QID) | ORAL | 2 refills | Status: AC | PRN

## 2020-10-19 MED ORDER — ACETAMINOPHEN 500 MG PO TABS
1000.0000 mg | ORAL_TABLET | Freq: Four times a day (QID) | ORAL | Status: DC | PRN
  Administered 2020-10-19: 1000 mg via ORAL
  Filled 2020-10-19: qty 2

## 2020-10-19 MED ORDER — HYDROXYZINE HCL 25 MG PO TABS
25.0000 mg | ORAL_TABLET | Freq: Four times a day (QID) | ORAL | 2 refills | Status: DC | PRN
Start: 2020-10-19 — End: 2020-10-19

## 2020-10-19 NOTE — Discharge Instructions (Signed)
Obstetrics: Normal and Problem Pregnancies (7th ed., pp. 102-121). [place], [place]: Elsevier."> Textbook of Family Medicine (9th ed., pp. 365-410). [place], [place]: [employer].">  First Trimester of Pregnancy  The first trimester of pregnancy starts on the first day of your last menstrual period until the end of week 12. This is months 1 through 3 of pregnancy. A week after a sperm fertilizes an egg, the egg will implant into the wall of the uterus and begin to develop into a baby. By the end of 12 weeks, all the baby's organs will be formed and the baby will be 2-3 inches in size. Body changes during your first trimester Your body goes through many changes during pregnancy. The changes vary and generally return to normal after your baby is born. Physical changes  You may gain or lose weight.  Your breasts may begin to grow larger and become tender. The tissue that surrounds your nipples (areola) may become darker.  Dark spots or blotches (chloasma or mask of pregnancy) may develop on your face.  You may have changes in your hair. These can include thickening or thinning of your hair or changes in texture. Health changes  You may feel nauseous, and you may vomit.  You may have heartburn.  You may develop headaches.  You may develop constipation.  Your gums may bleed and may be sensitive to brushing and flossing. Other changes  You may tire easily.  You may urinate more often.  Your menstrual periods will stop.  You may have a loss of appetite.  You may develop cravings for certain kinds of food.  You may have changes in your emotions from day to day.  You may have more vivid and strange dreams. Follow these instructions at home: Medicines  Follow your health care provider's instructions regarding medicine use. Specific medicines may be either safe or unsafe to take during pregnancy. Do not take any medicines unless told to by your health care provider.  Take a  prenatal vitamin that contains at least 600 micrograms (mcg) of folic acid. Eating and drinking  Eat a healthy diet that includes fresh fruits and vegetables, whole grains, good sources of protein such as meat, eggs, or tofu, and low-fat dairy products.  Avoid raw meat and unpasteurized juice, milk, and cheese. These carry germs that can harm you and your baby.  If you feel nauseous or you vomit: ? Eat 4 or 5 small meals a day instead of 3 large meals. ? Try eating a few soda crackers. ? Drink liquids between meals instead of during meals.  You may need to take these actions to prevent or treat constipation: ? Drink enough fluid to keep your urine pale yellow. ? Eat foods that are high in fiber, such as beans, whole grains, and fresh fruits and vegetables. ? Limit foods that are high in fat and processed sugars, such as fried or sweet foods. Activity  Exercise only as directed by your health care provider. Most people can continue their usual exercise routine during pregnancy. Try to exercise for 30 minutes at least 5 days a week.  Stop exercising if you develop pain or cramping in the lower abdomen or lower back.  Avoid exercising if it is very hot or humid or if you are at high altitude.  Avoid heavy lifting.  If you choose to, you may have sex unless your health care provider tells you not to. Relieving pain and discomfort  Wear a good support bra to relieve breast   tenderness.  Rest with your legs elevated if you have leg cramps or low back pain.  If you develop bulging veins (varicose veins) in your legs: ? Wear support hose as told by your health care provider. ? Elevate your feet for 15 minutes, 3-4 times a day. ? Limit salt in your diet. Safety  Wear your seat belt at all times when driving or riding in a car.  Talk with your health care provider if someone is verbally or physically abusive to you.  Talk with your health care provider if you are feeling sad or have  thoughts of hurting yourself. Lifestyle  Do not use hot tubs, steam rooms, or saunas.  Do not douche. Do not use tampons or scented sanitary pads.  Do not use herbal remedies, alcohol, illegal drugs, or medicines that are not approved by your health care provider. Chemicals in these products can harm your baby.  Do not use any products that contain nicotine or tobacco, such as cigarettes, e-cigarettes, and chewing tobacco. If you need help quitting, ask your health care provider.  Avoid cat litter boxes and soil used by cats. These carry germs that can cause birth defects in the baby and possibly loss of the unborn baby (fetus) by miscarriage or stillbirth. General instructions  During routine prenatal visits in the first trimester, your health care provider will do a physical exam, perform necessary tests, and ask you how things are going. Keep all follow-up visits. This is important.  Ask for help if you have counseling or nutritional needs during pregnancy. Your health care provider can offer advice or refer you to specialists for help with various needs.  Schedule a dentist appointment. At home, brush your teeth with a soft toothbrush. Floss gently.  Write down your questions. Take them to your prenatal visits. Where to find more information  American Pregnancy Association: americanpregnancy.org  American College of Obstetricians and Gynecologists: acog.org/en/Womens%20Health/Pregnancy  Office on Women's Health: womenshealth.gov/pregnancy Contact a health care provider if you have:  Dizziness.  A fever.  Mild pelvic cramps, pelvic pressure, or nagging pain in the abdominal area.  Nausea, vomiting, or diarrhea that lasts for 24 hours or longer.  A bad-smelling vaginal discharge.  Pain when you urinate.  Known exposure to a contagious illness, such as chickenpox, measles, Zika virus, HIV, or hepatitis. Get help right away if you have:  Spotting or bleeding from your  vagina.  Severe abdominal cramping or pain.  Shortness of breath or chest pain.  Any kind of trauma, such as from a fall or a car crash.  New or increased pain, swelling, or redness in an arm or leg. Summary  The first trimester of pregnancy starts on the first day of your last menstrual period until the end of week 12 (months 1 through 3).  Eating 4 or 5 small meals a day rather than 3 large meals may help to relieve nausea and vomiting.  Do not use any products that contain nicotine or tobacco, such as cigarettes, e-cigarettes, and chewing tobacco. If you need help quitting, ask your health care provider.  Keep all follow-up visits. This is important. This information is not intended to replace advice given to you by your health care provider. Make sure you discuss any questions you have with your health care provider. Document Revised: 10/07/2019 Document Reviewed: 08/13/2019 Elsevier Patient Education  2021 Elsevier Inc.  

## 2020-10-19 NOTE — MAU Note (Signed)
RN entered room to discharge pt, found pt tearful and complaining of anxiety.  RN discussed pt's concerns about the state of her pregnancy.   Pt  requesting medication to help "calm her nerves".  CNM made aware, see MAR

## 2020-10-19 NOTE — MAU Provider Note (Signed)
Chief Complaint: Abdominal Pain   Event Date/Time   First Provider Initiated Contact with Patient 10/19/20 2155        SUBJECTIVE HPI: Pamela Reilly is a 35 y.o. G3P0020 at [redacted]w[redacted]d by LMP who presents to maternity admissions reporting sharp burning pain in pelvis today.  Worried it is a new indication of ectopic pregnancy   Was just here yesterday evening for same.  Full workup done, showed early pregnancy with nothing seen in uterus yet. . She denies vaginal bleeding, vaginal itching/burning, urinary symptoms, h/a, dizziness, n/v, or fever/chills.    Abdominal Pain This is a recurrent problem. The current episode started today. The problem occurs intermittently. The problem has been unchanged. The quality of the pain is burning. The abdominal pain does not radiate. Pertinent negatives include no constipation, diarrhea, dysuria, fever, nausea or vomiting. Nothing aggravates the pain. The pain is relieved by nothing. She has tried nothing for the symptoms.   RN Note: Lower abdominal and back pain, pt was here last night and DC early this morning. Has hx of ectopic pregnancy  Past Medical History:  Diagnosis Date  . ADHD (attention deficit hyperactivity disorder)   . Asthma    exercise induced  . Asthma due to seasonal allergies 07/29/2013  . Ovarian cyst    Past Surgical History:  Procedure Laterality Date  . APPENDECTOMY    . BREAST REDUCTION SURGERY    . DIAGNOSTIC LAPAROSCOPY WITH REMOVAL OF ECTOPIC PREGNANCY Right 06/11/2019   Procedure: DIAGNOSTIC LAPAROSCOPY WITH REMOVAL OF ECTOPIC PREGNANCY;  Surgeon: [place] Bing, MD;  Location: [facility] OR;  Service: Gynecology;  Laterality: Right;  . LAPAROSCOPIC UNILATERAL SALPINGECTOMY Right 06/11/2019   Procedure: Laparoscopic Unilateral Salpingectomy;  Surgeon:  Bing, MD;  Location: [facility] OR;  Service: Gynecology;  Laterality: Right;  . TONSILLECTOMY  2015   Social History   Socioeconomic History  . Marital status: Single    Spouse  name: Not on file  . Number of children: Not on file  . Years of education: Not on file  . Highest education level: Not on file  Occupational History  . Not on file  Tobacco Use  . Smoking status: Former Smoker    Types: Cigarettes  . Smokeless tobacco: Never Used  . Tobacco comment: was recreationally when younger  Vaping Use  . Vaping Use: Never used  Substance and Sexual Activity  . Alcohol use: Not Currently    Comment: social  . Drug use: Never  . Sexual activity: Yes    Birth control/protection: None  Other Topics Concern  . Not on file  Social History Narrative  . Not on file   Social Determinants of Health   Financial Resource Strain: Not on file  Food Insecurity: Not on file  Transportation Needs: Not on file  Physical Activity: Not on file  Stress: Not on file  Social Connections: Not on file  Intimate Partner Violence: Not on file   No current facility-administered medications on file prior to encounter.   Current Outpatient Medications on File Prior to Encounter  Medication Sig Dispense Refill  . acetaminophen (TYLENOL) 500 MG tablet Take 2 tablets (1,000 mg total) by mouth every 8 (eight) hours as needed. (Patient taking differently: Take 1,000 mg by mouth every 8 (eight) hours as needed for headache (pain). ) 45 tablet 1  . albuterol (PROVENTIL HFA;VENTOLIN HFA) 108 (90 Base) MCG/ACT inhaler Inhale 2 puffs into the lungs every 6 (six) hours as needed for wheezing or shortness of breath. (  Patient taking differently: Inhale 2 puffs into the lungs every 6 (six) hours as needed for wheezing or shortness of breath (exercise induced asthma). ) 1 Inhaler 2  . amphetamine-dextroamphetamine (ADDERALL) 30 MG tablet Take 7.5 mg by mouth 4 (four) times a week.     . traZODone (DESYREL) 50 MG tablet Take 50 mg by mouth at bedtime as needed for sleep.      No Known Allergies  I have reviewed patient's Past Medical Hx, Surgical Hx, Family Hx, Social Hx, medications and  allergies.   ROS:  Review of Systems  Constitutional: Negative for fever.  Gastrointestinal: Positive for abdominal pain. Negative for constipation, diarrhea, nausea and vomiting.  Genitourinary: Negative for dysuria.   Review of Systems  Other systems negative   Physical Exam  Physical Exam Patient Vitals for the past 24 hrs:  BP Temp Temp src Pulse Resp SpO2  10/19/20 2148 136/81 98.1 F (36.7 C) Oral (!) 104 20 99 %   Constitutional: Well-developed, well-nourished female in no acute distress.  Cardiovascular: normal rate Respiratory: normal effort GI: Abd soft, non-tender. Pos BS x 4 MS: Extremities nontender, no edema, normal ROM Neurologic: Alert and oriented x 4.  GU: Neg CVAT.  PELVIC EXAM: not repeated  LAB RESULTS No results found for this or any previous visit (from the past 24 hour(s)).     IMAGING (last night) US OB LESS THAN 14 WEEKS WITH OB TRANSVAGINAL  Result Date: 10/18/2020 CLINICAL DATA:  Vaginal bleeding. EXAM: OBSTETRIC <14 WK [place] AND TRANSVAGINAL OB US TECHNIQUE: Both transabdominal and transvaginal ultrasound examinations were performed for complete evaluation of the gestation as well as the maternal uterus, adnexal regions, and pelvic cul-de-sac. Transvaginal technique was performed to assess early pregnancy. COMPARISON:  None. FINDINGS: Intrauterine gestational sac: None Yolk sac:  Not Visualized. Embryo:  Not Visualized. Cardiac Activity: Not Visualized. Heart Rate: N/A  bpm Subchorionic hemorrhage:  None visualized. Maternal uterus/adnexae: The right ovary is visualized and is normal in appearance. A corpus luteum cyst is seen within the otherwise normal appearing left ovary. A trace amount of pelvic free fluid is seen. IMPRESSION: No evidence of an intrauterine pregnancy. Correlation with follow-up pelvic ultrasound and serial beta HCG levels is recommended if this remains of clinical concern. Electronically Signed   By: Aram Candela M.D.   On:  10/18/2020 22:13    MAU Management/MDM: Discussed with Dr Despina Hidden who does not recommend repeating labs or [place] at this time Discussed with patient, since she had surgical management of her ectopic pregnancy last time, this may be some scar tissue/adhesions that are being pulled Will Rx some vistaril so she can sleep.  She states it did help her sleep last night  ASSESSMENT Pregnancy at [redacted]w[redacted]d Unsure location and viability of above Sharp burning pain in pelvis, likely due to adhesions  PLAN Discharge home Recommend continue plan to repeat HCG Thurs or Friday Rx Vistaril for anxiety at home  Pt stable at time of discharge. Encouraged to return here if she develops worsening of symptoms, increase in pain, fever, or other concerning symptoms.    Wynelle Bourgeois CNM, MSN Certified Nurse-Midwife 10/19/2020  9:55 PM

## 2020-10-19 NOTE — MAU Note (Signed)
Lower abdominal and back pain, pt was here last night and DC early this morning. Has hx of ectopic pregnancy

## 2020-10-21 ENCOUNTER — Ambulatory Visit (INDEPENDENT_AMBULATORY_CARE_PROVIDER_SITE_OTHER): Payer: Self-pay | Admitting: General Practice

## 2020-10-21 ENCOUNTER — Other Ambulatory Visit: Payer: Self-pay

## 2020-10-21 ENCOUNTER — Encounter: Payer: Self-pay | Admitting: General Practice

## 2020-10-21 DIAGNOSIS — O3680X0 Pregnancy with inconclusive fetal viability, not applicable or unspecified: Secondary | ICD-10-CM

## 2020-10-21 LAB — BETA HCG QUANT (REF LAB): hCG Quant: 232 m[IU]/mL

## 2020-10-21 NOTE — Progress Notes (Signed)
Patient presents to office today for stat bhcg following up from MAU visit on 6/8. Patient reports continued bleeding like a period and cramping/burning in lower abdomen/pelvis similar to menstrual cramps. Discussed with patient we are monitoring your bhcg levels today, results take approximately 2 hour to finalize and will be reviewed with a provider. Advised patient we will then call you at that time with results/updated plan of care. Patient verbalized understanding & provided call back number [phone].  Reviewed results with Dr Vergie Living who states large decrease in bhcg levels is indicative of SAB- patient should follow up in 7-10 days for repeat bhcg.  Called patient and reviewed results with her. Discussed follow up bhcg with patient & she states she can come 6/17 @ 11am. Advised patient bleeding should continue to lighten over the several next days and to expect a period 4-6 weeks after hormone levels have returned to normal. Patient verbalized understanding to all.  Chase Caller RN BSN 10/21/20

## 2020-10-28 NOTE — Progress Notes (Signed)
Patient was assessed and managed by nursing staff during this encounter. I have reviewed the chart and agree with the documentation and plan. I have also made any necessary editorial changes.  Clint Biello, MD 10/28/2020 3:07 PM   

## 2021-02-01 IMAGING — US US OB < 14 WEEKS - US OB TV
1 series · 15 of 28 positions shown · non-contrast
Comparison: None.

CLINICAL DATA: Vaginal bleeding with unknown last menstrual period.
Quantitative beta HCG 434.

EXAM:
OBSTETRIC <14 WK US AND TRANSVAGINAL OB US
TECHNIQUE: Both transabdominal and transvaginal ultrasound examinations were
performed for complete evaluation of the gestation as well as the
maternal uterus, adnexal regions, and pelvic cul-de-sac.
Transvaginal technique was performed to assess early pregnancy.

[Series 1: us ob < 14 weeks - us ob tv · 15 of 65 slices shown]
[im 1/65]
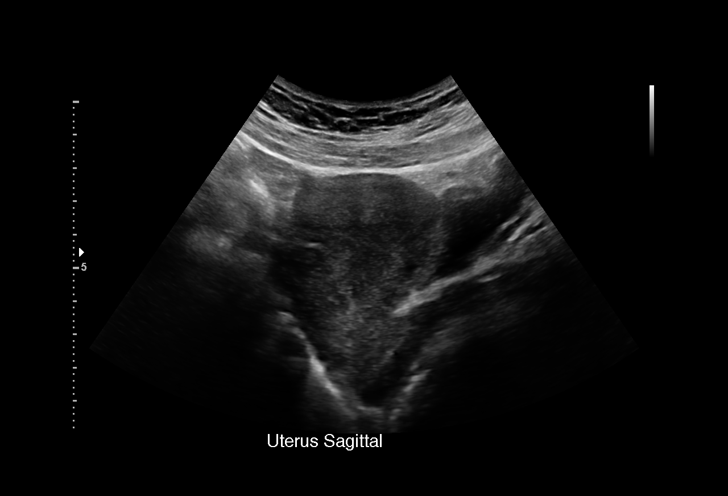
[im 5/65]
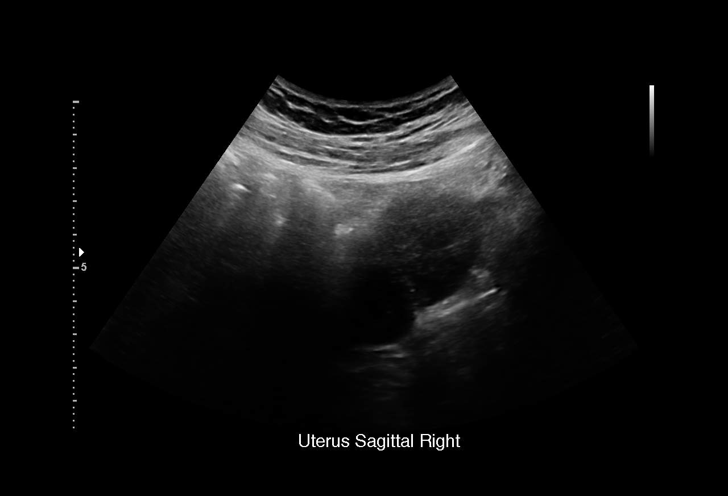
[im 10/65]
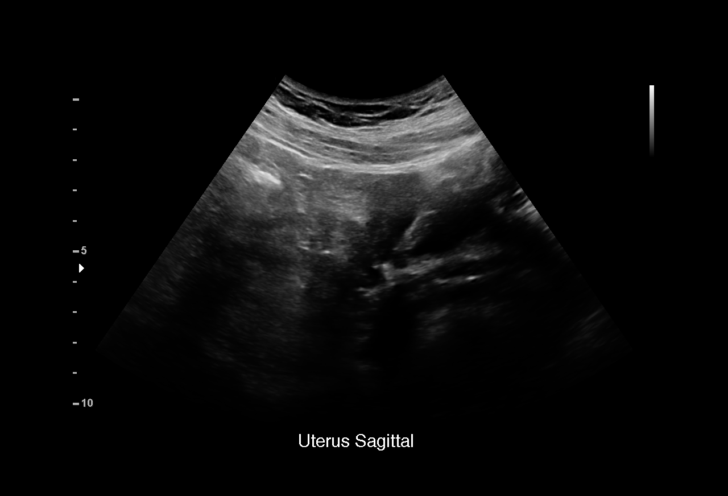
[im 15/65]
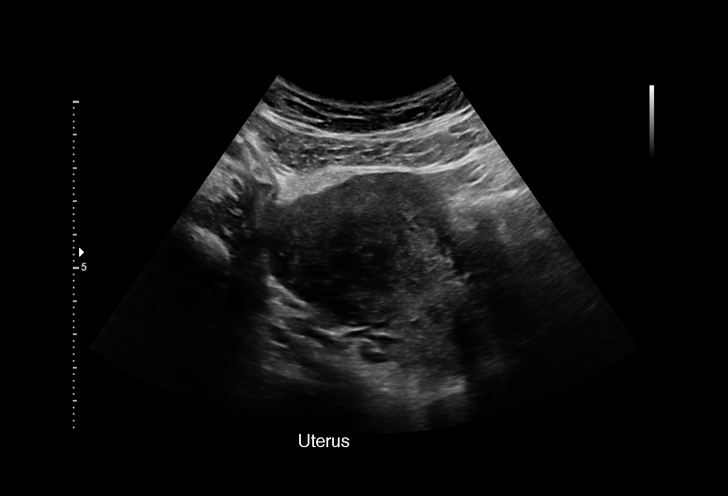
[im 19/65]
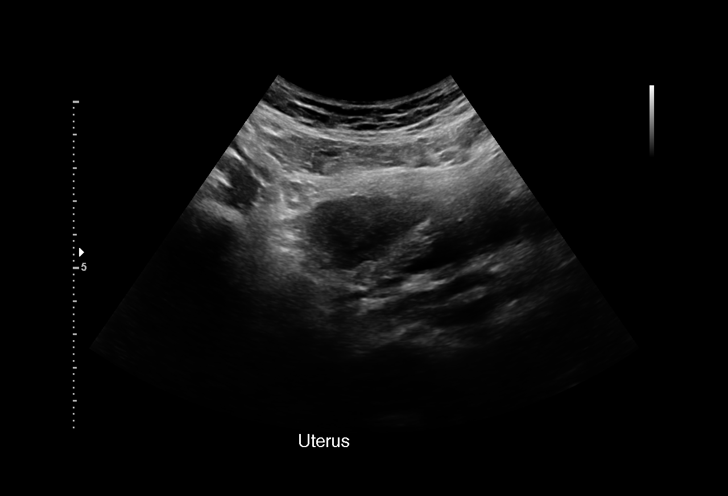
[im 24/65]
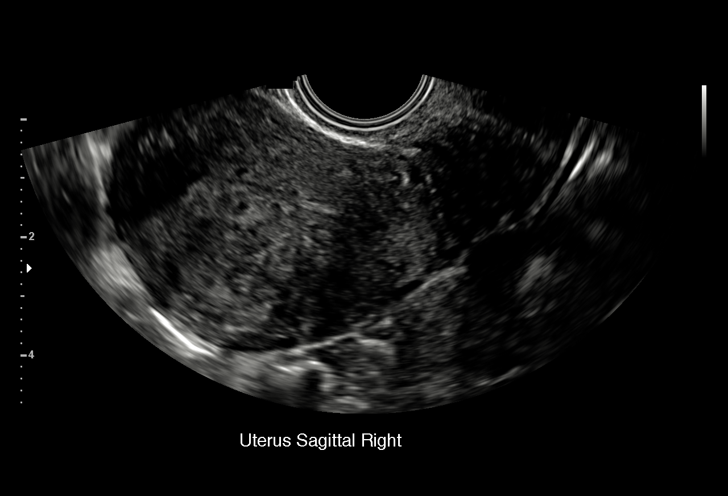
[im 29/65]
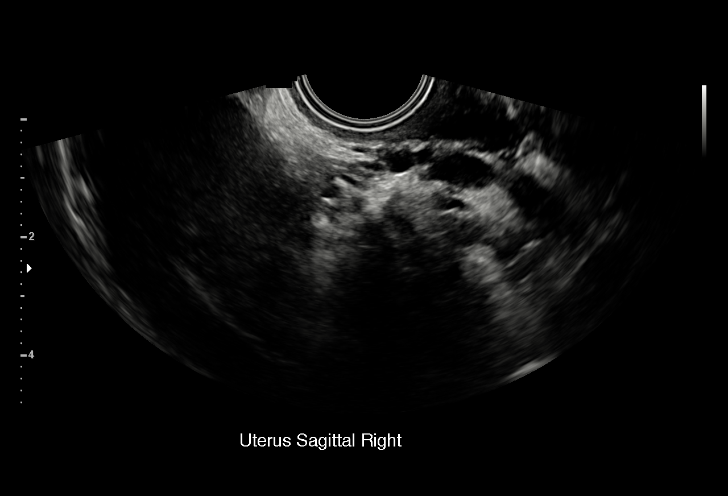
[im 34/65]
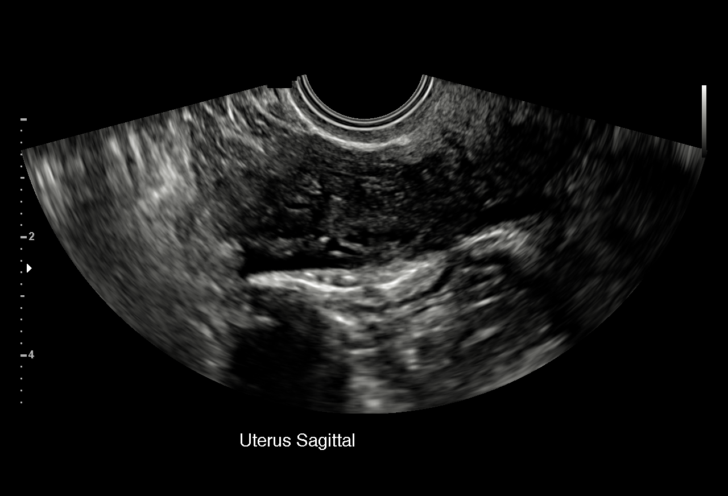
[im 36/65]
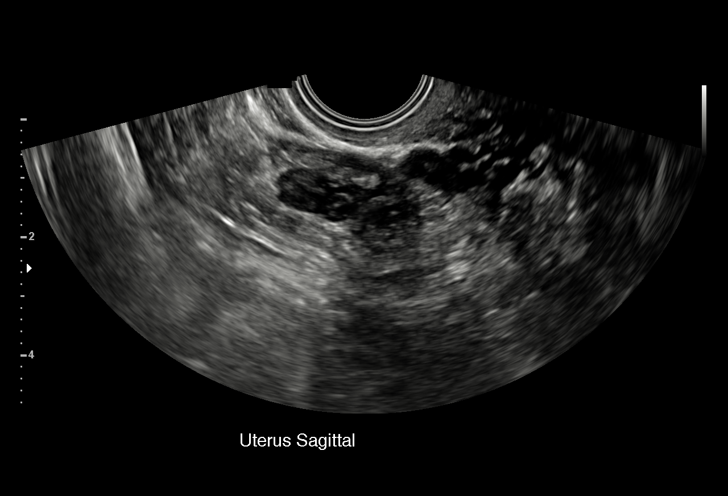
[im 41/65]
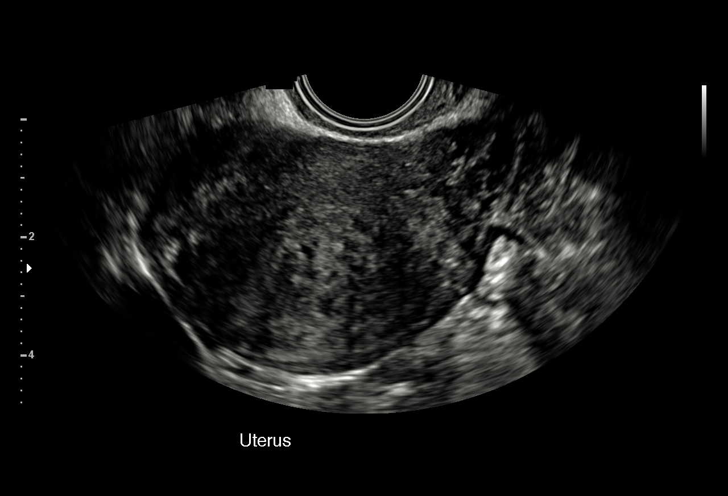
[im 46/65]
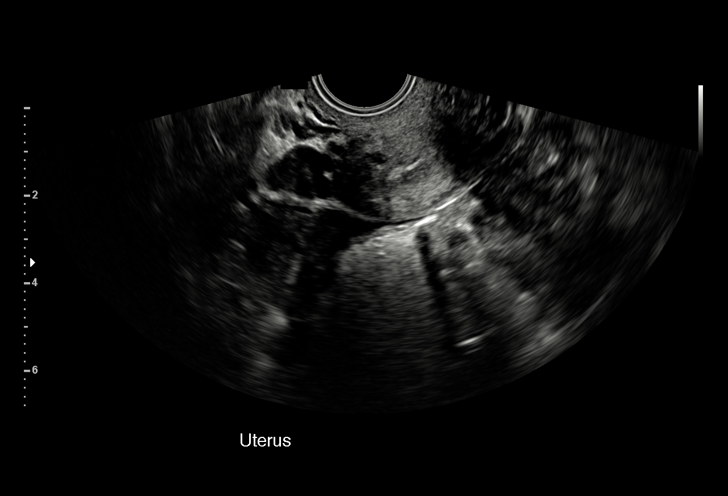
[im 50/65]
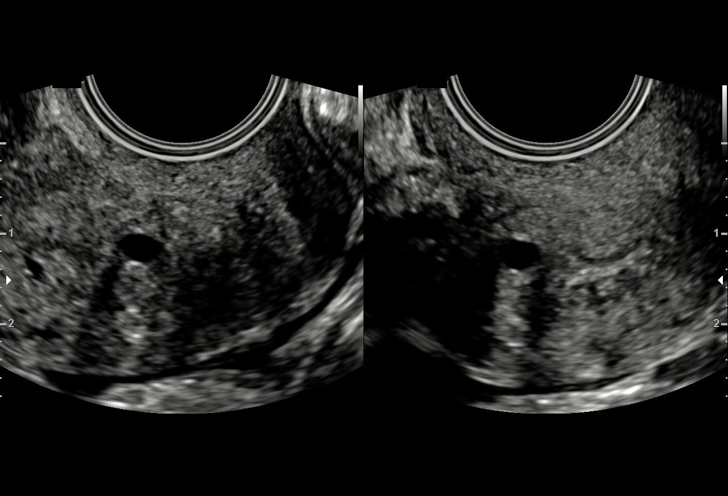
[im 55/65]
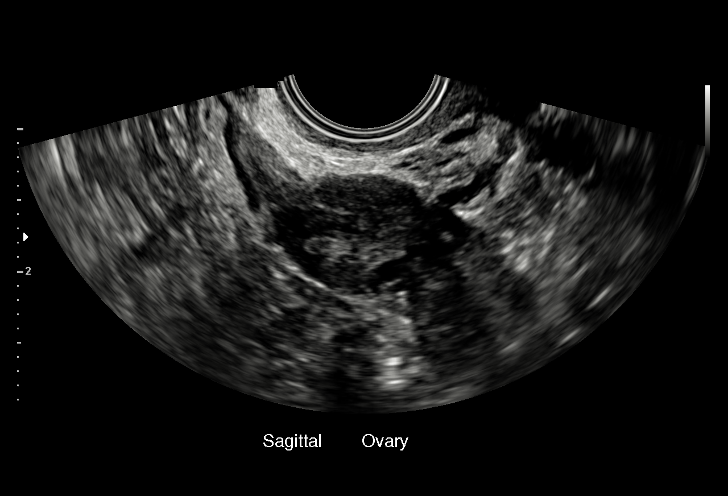
[im 60/65]
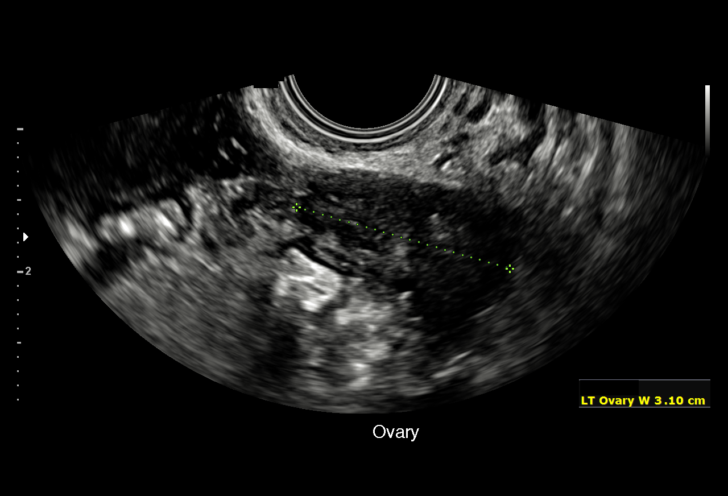
[im 65/65]
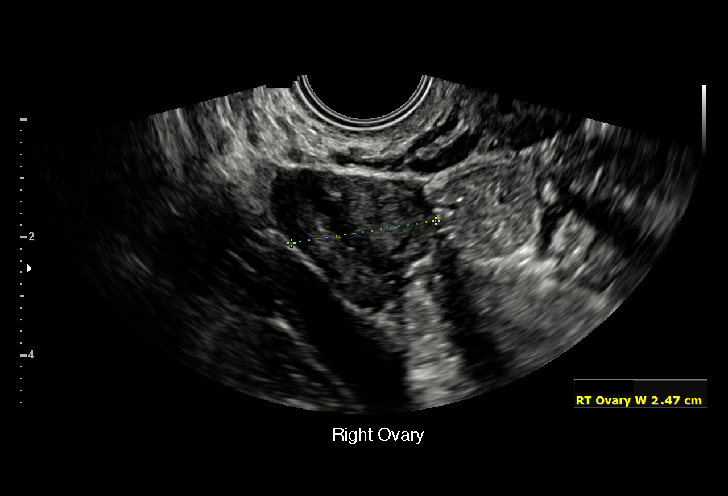

[15 of 28 positions shown; findings below may reference images not displayed]

FINDINGS: Intrauterine gestational sac: Not visualized

Yolk sac:  Not visualized

Embryo:  Not visualized

Cardiac Activity: Not visualized

Maternal uterus/adnexae: Normal appearing maternal ovaries with a
left ovarian corpus luteum noted. Trace amount of free peritoneal
fluid. No adnexal mass seen.
IMPRESSION: No intrauterine or extrauterine gestation seen. Correlation with
serial quantitative beta HCG determinations is recommended.

## 2021-02-05 IMAGING — US US OB TRANSVAGINAL
1 series · 15 of 25 positions shown · non-contrast
Comparison: June 01, 2019

CLINICAL DATA: Slowly pelvic pain.  Slowly rising beta HCG

EXAM:
TRANSVAGINAL OB ULTRASOUND
TECHNIQUE: Transvaginal ultrasound was performed for complete evaluation of the
gestation as well as the maternal uterus, adnexal regions, and
pelvic cul-de-sac.

[Series 1: us ob transvaginal · 25 acquisitions, 15 frames shown]
[im 1/25]
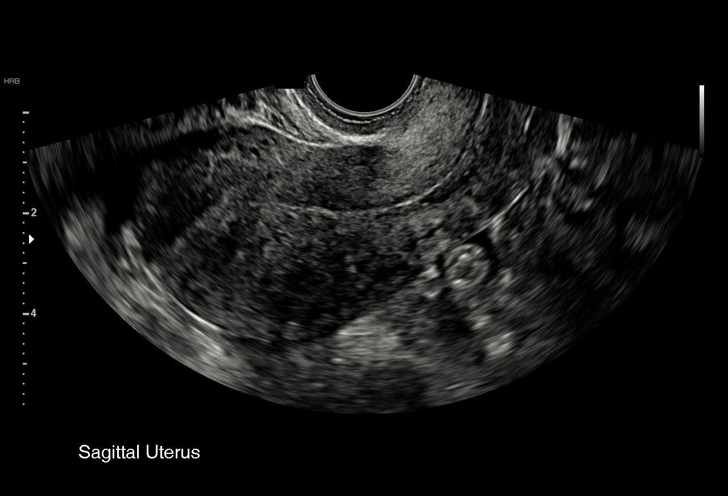
[im 3/25]
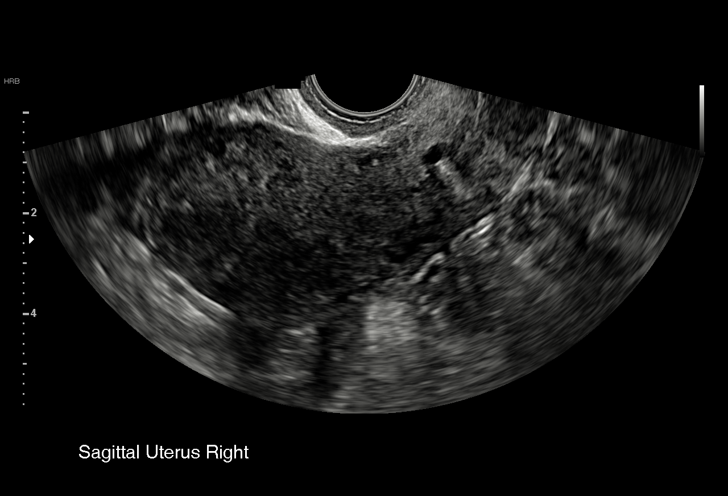
[im 5/25]
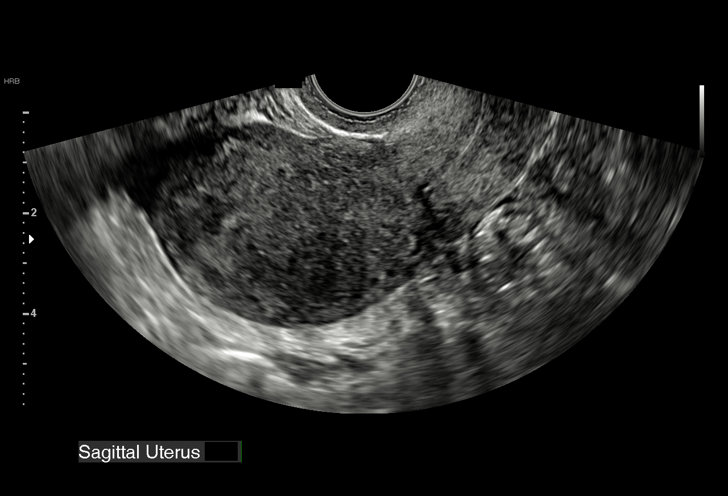
[im 6/25]
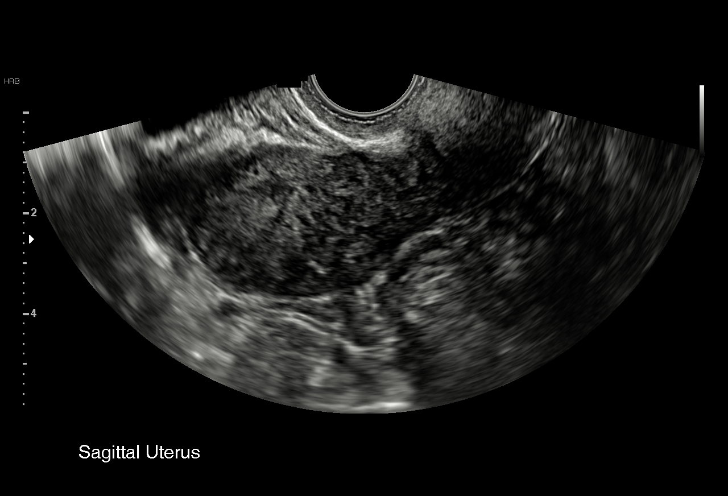
[im 8/25]
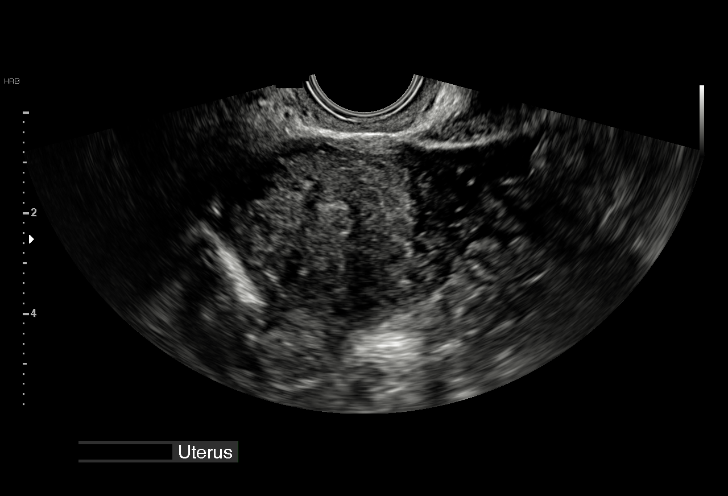
[im 10/25]
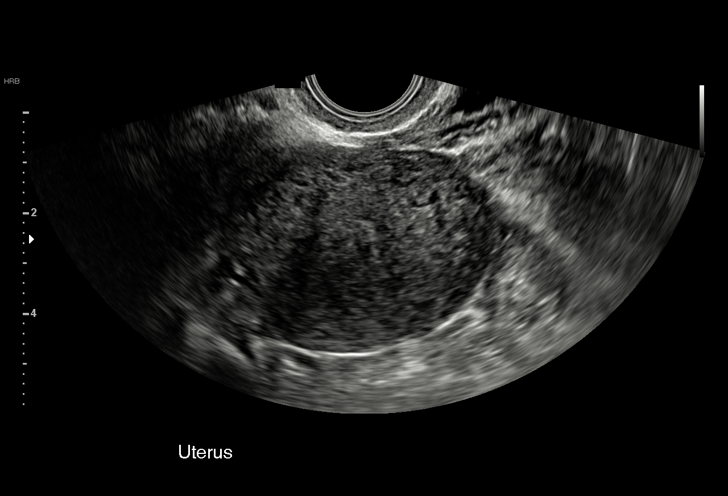
[im 11/25]
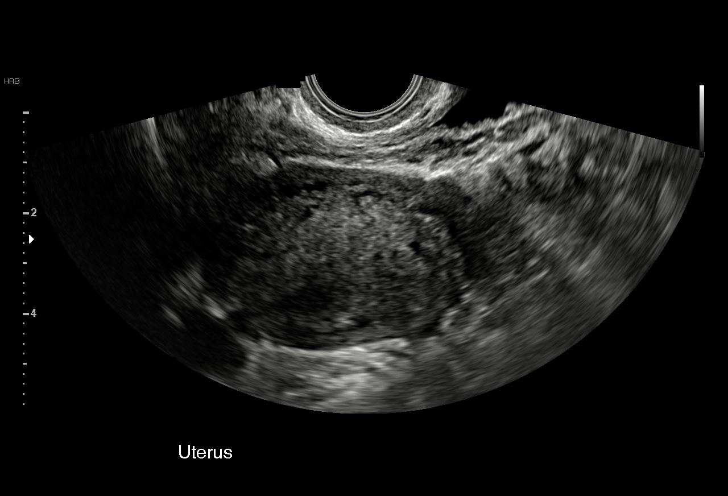
[im 13/25]
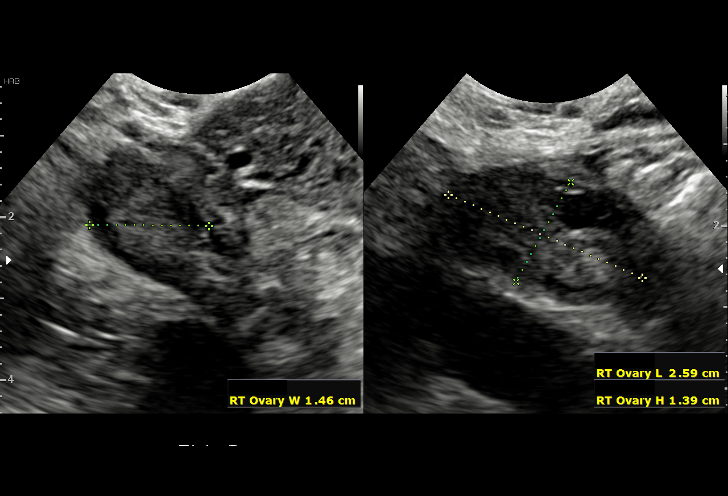
[im 15/25]
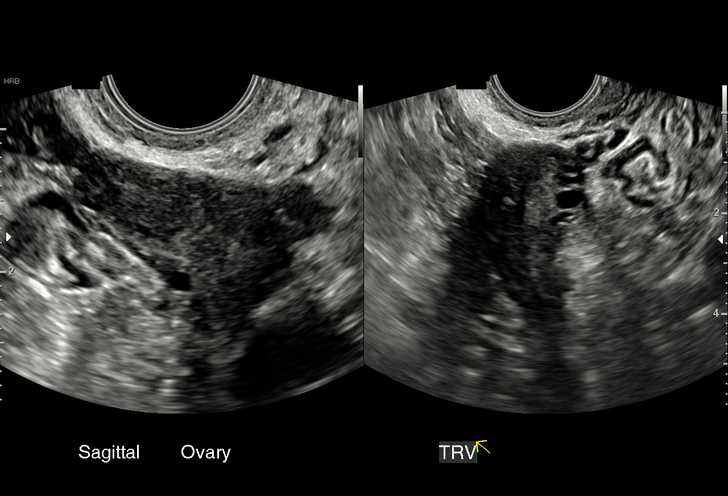
[im 16/25]
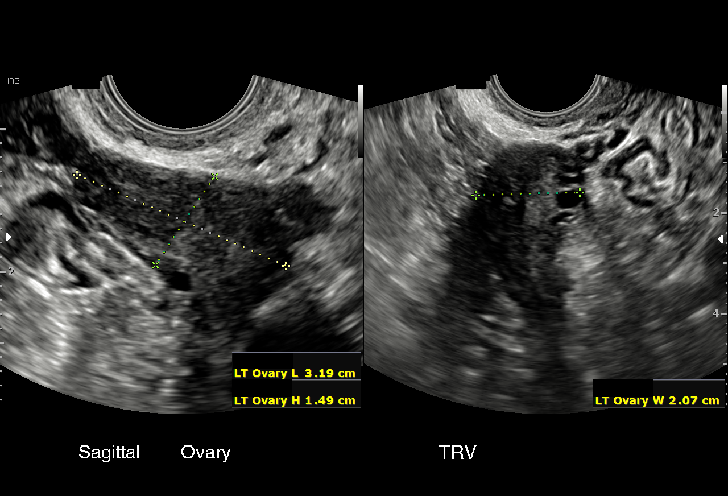
[im 18/25]
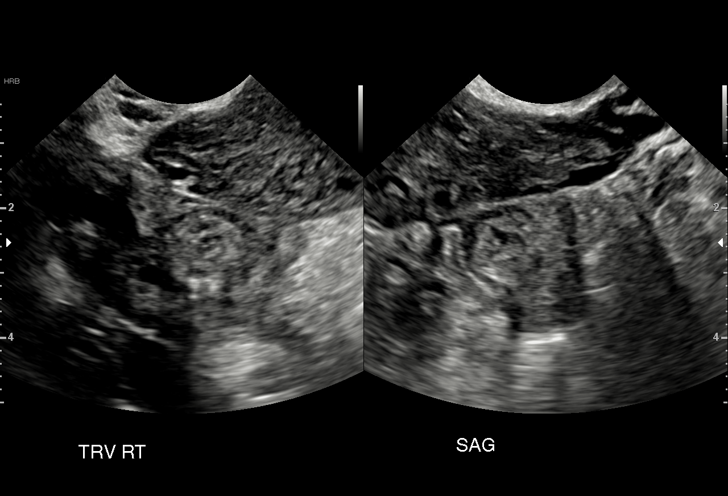
[im 20/25]
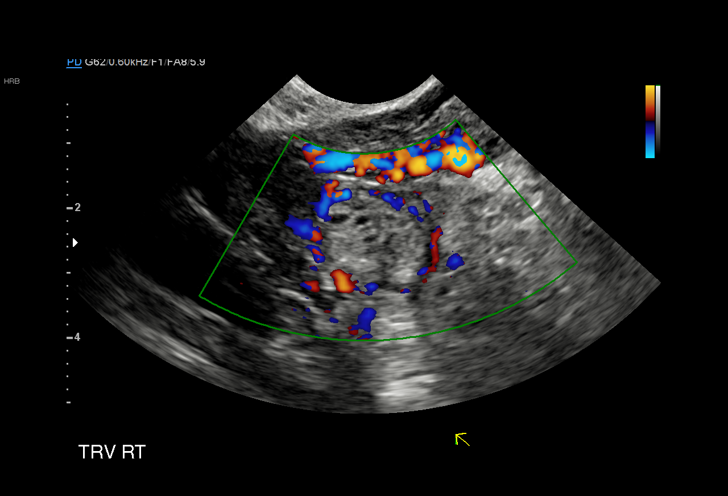
[im 21/25]
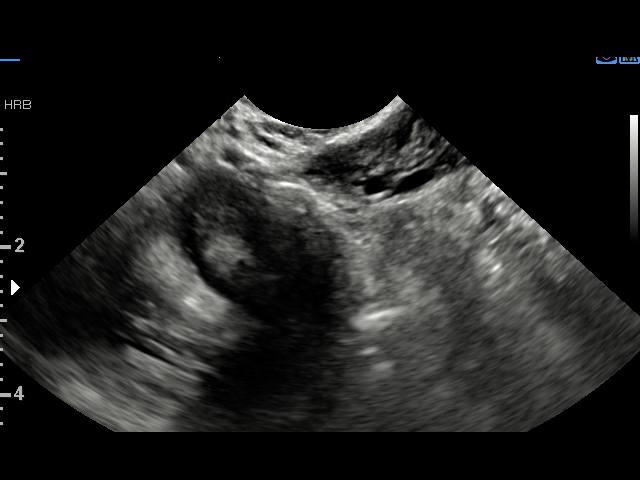
[im 23/25]
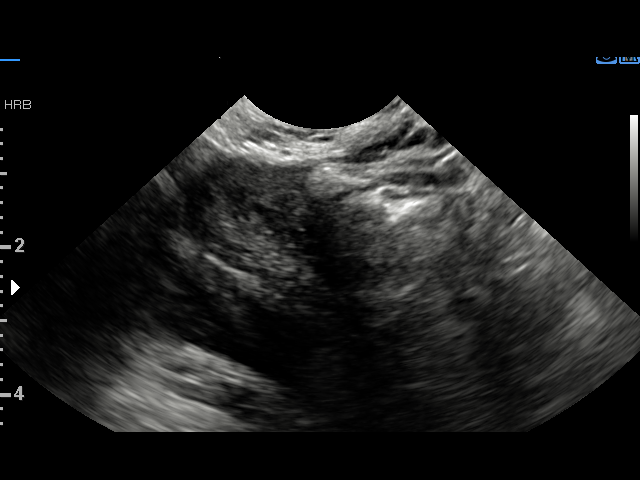
[im 25/25]
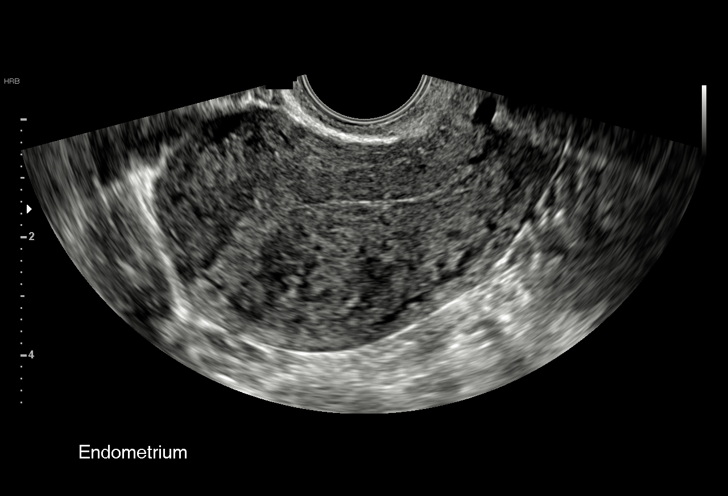

[15 of 25 positions shown; findings below may reference images not displayed]

FINDINGS: Intrauterine gestational sac: Not visualized

Yolk sac:  Not visualized

Embryo:  Not visualized

Cardiac Activity: Not visualized

Subchorionic hemorrhage:  None visualized.

Maternal uterus/adnexae: There is a new hyperechoic area in the
right adnexa measuring 1.4 x 1.3 x 1.3 cm which appears hemorrhagic
with complex cystic areas within this region. There is
hypervascularity surrounding this structure. This area is concerning
for potential ectopic gestation with hemorrhage. A live ectopic
gestation is not seen. There is no other extrauterine pelvic mass.
There is trace free pelvic fluid.
IMPRESSION: Echogenic masslike area in the right adnexa with surrounding
hypervascularity concerning for ectopic gestation. This finding is
new compared to recent prior study. No live ectopic gestation seen.
No intrauterine gestation evident. Study otherwise unremarkable
except for slight free pelvic fluid.

Critical Value/emergent results were called by telephone at the time
of interpretation on 06/05/2019 at [DATE] to Tsz Yiu
ITICHA , who verbally acknowledged these results.

## 2021-08-02 IMAGING — US US PELVIS COMPLETE TRANSABD/TRANSVAG W DUPLEX
1 series · 13 of 25 positions shown · non-contrast
Comparison: None.

CLINICAL DATA: Right lower quadrant abdominal pain, vaginal
bleeding, history of right salpingectomy

EXAM:
TRANSABDOMINAL AND TRANSVAGINAL ULTRASOUND OF PELVIS
DOPPLER ULTRASOUND OF OVARIES
TECHNIQUE: Both transabdominal and transvaginal ultrasound examinations of the
pelvis were performed. Transabdominal technique was performed for
global imaging of the pelvis including uterus, ovaries, adnexal
regions, and pelvic cul-de-sac.
It was necessary to proceed with endovaginal exam following the
transabdominal exam to visualize the the ovaries bilaterally. Color
and duplex Doppler ultrasound was utilized to evaluate blood flow to
the ovaries.

[Series 1: us pelvic complete w transvaginal and torsion righ · 13 of 38 slices shown]
[im 1/38]
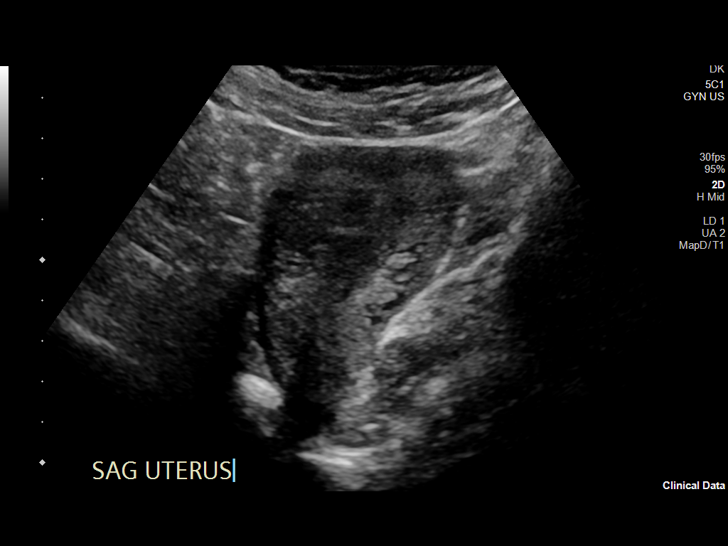
[im 4/38]
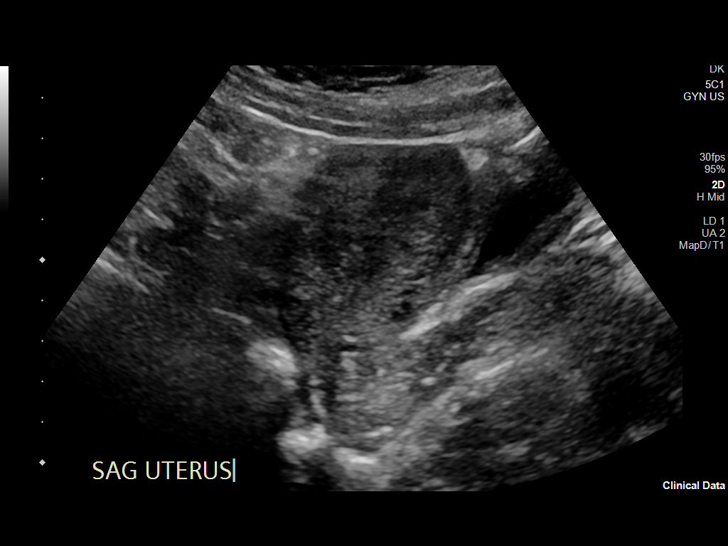
[im 7/38]
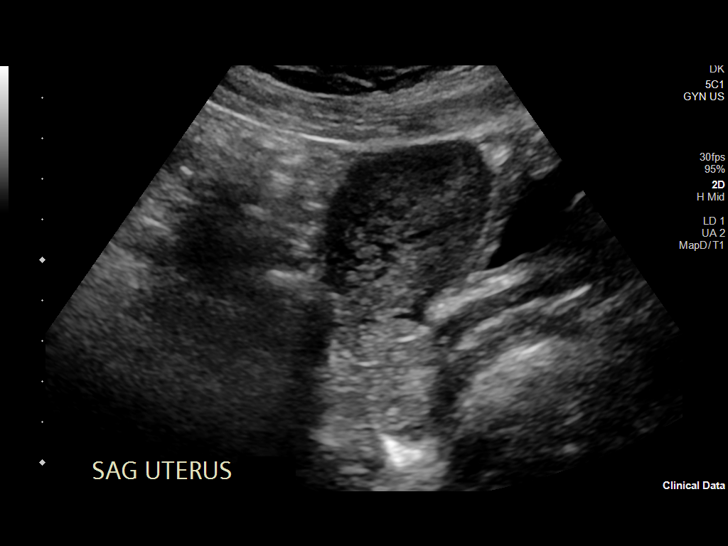
[im 10/38]
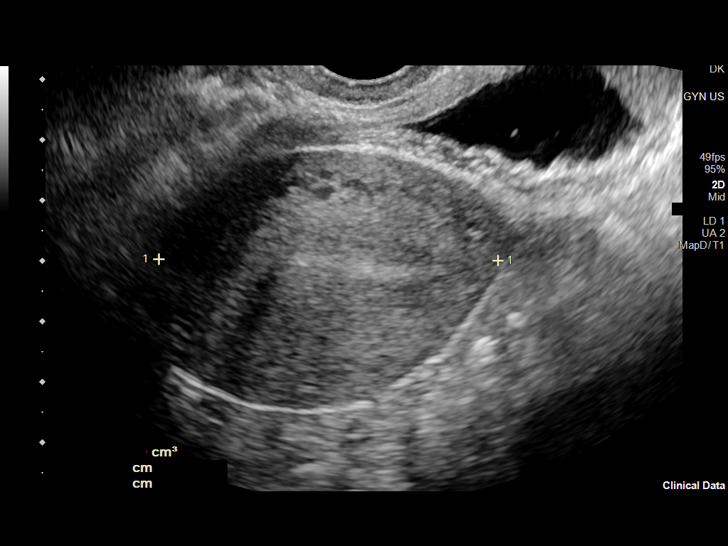
[im 13/38]
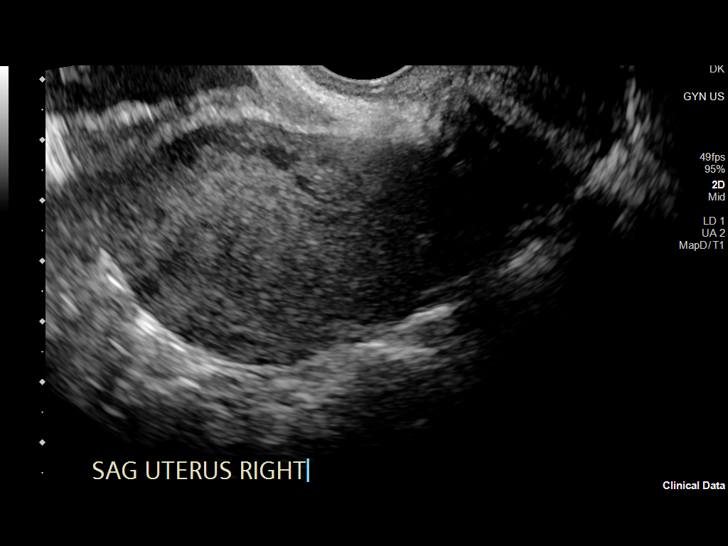
[im 16/38]
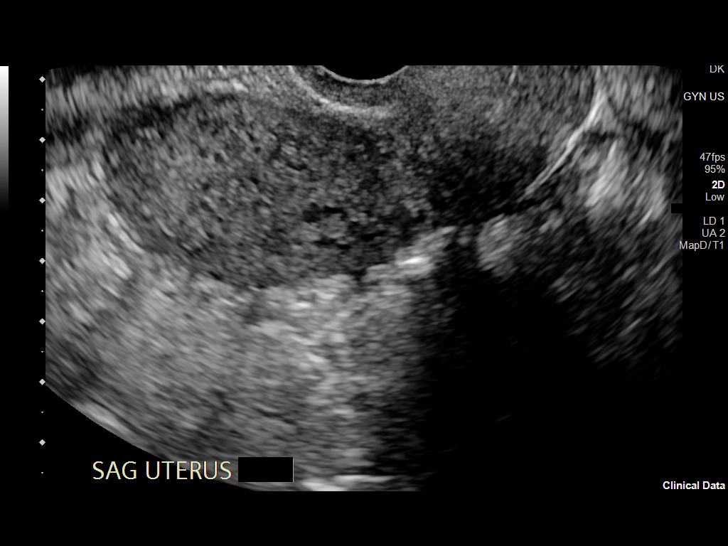
[im 19/38]
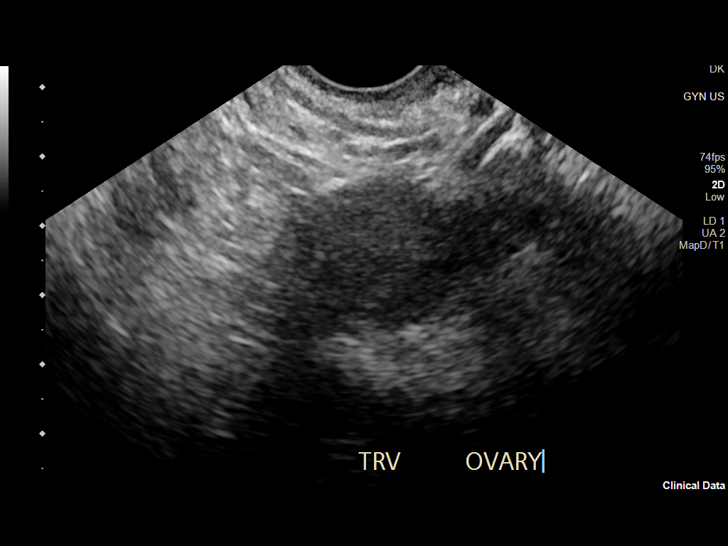
[im 22/38]
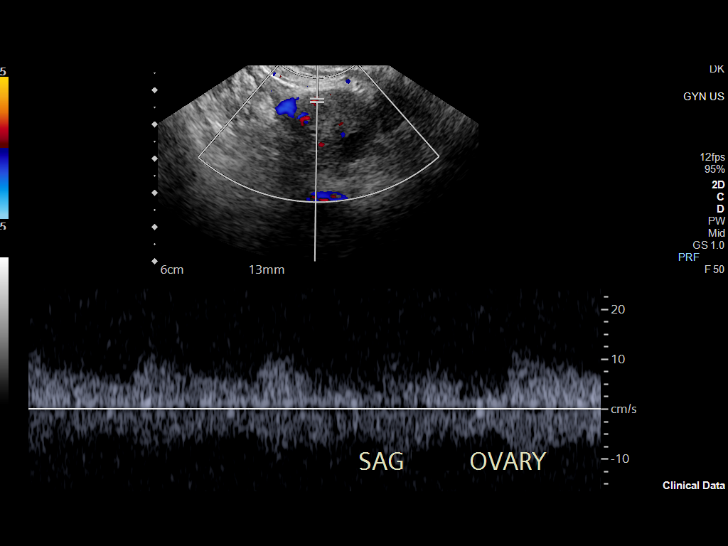
[im 25/38]
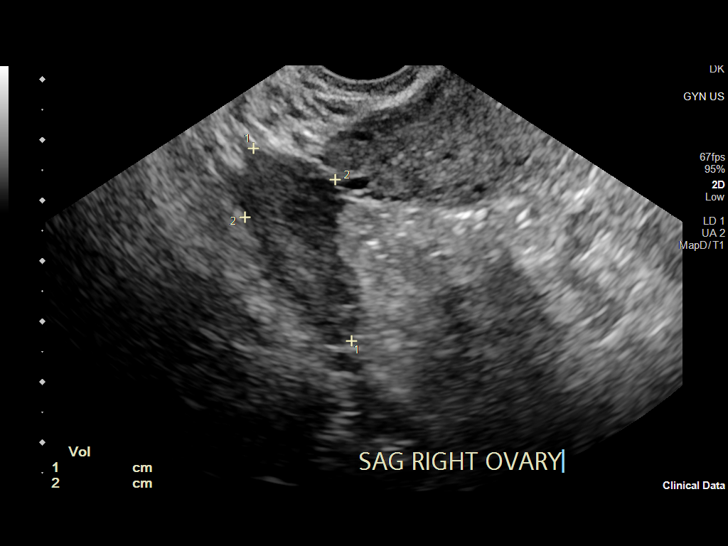
[im 28/38]
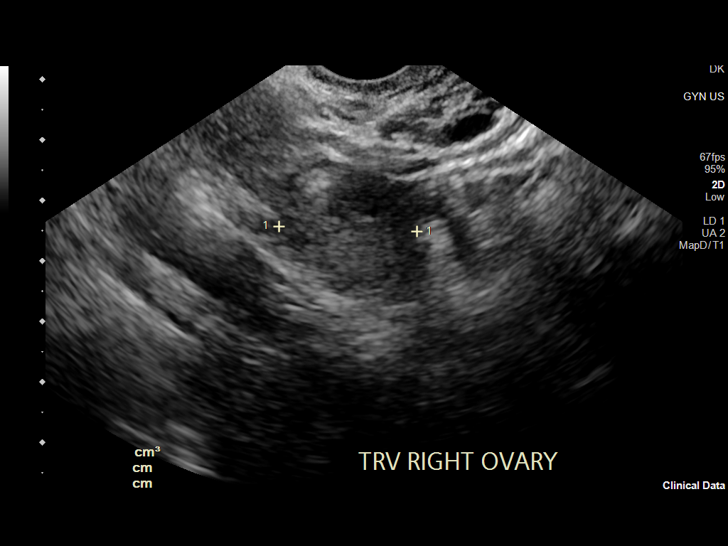
[im 31/38]
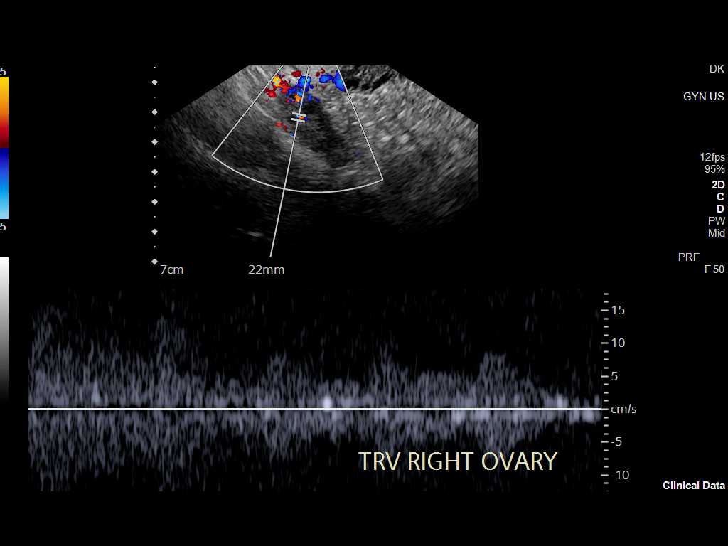
[im 34/38]
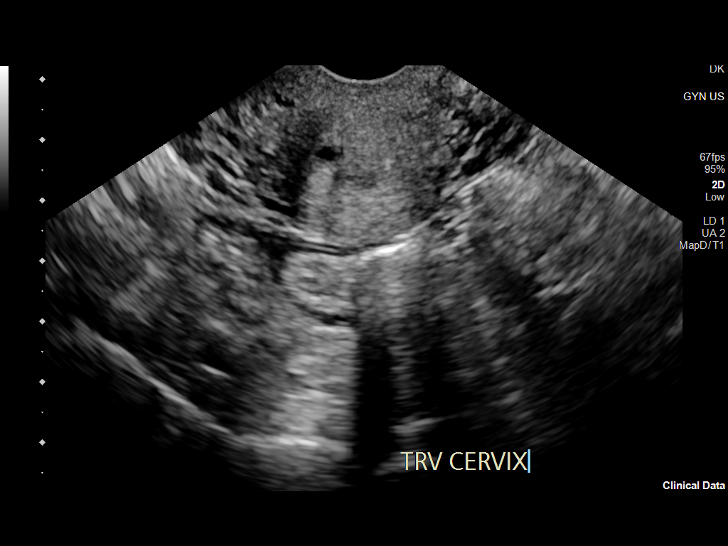
[im 38/38]
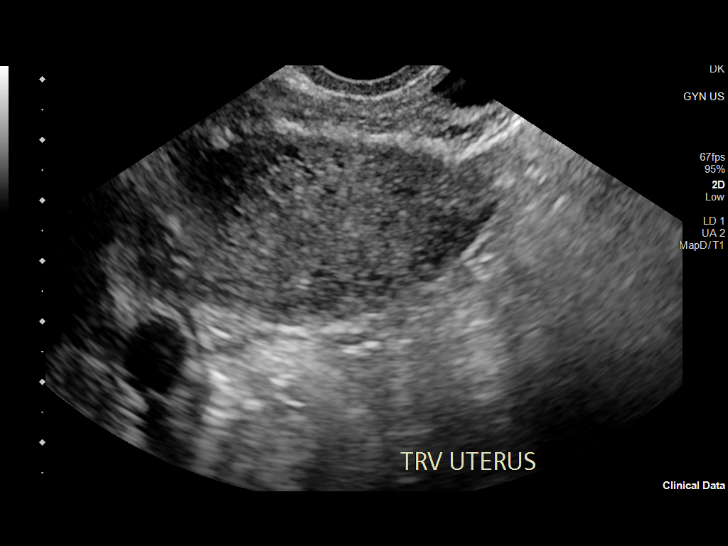

[13 of 25 positions shown; findings below may reference images not displayed]

FINDINGS: Uterus

Measurements: 8.4 x 4.3 x 5.6 cm = volume: 105 mL. No fibroids or
other mass visualized.

Endometrium

Thickness: 3 mm.  No focal abnormality visualized.

Right ovary

Measurements: 3.6 x 1.6 x 2.3 cm = volume: 7 mL. Normal
appearance/no adnexal mass.

Left ovary

Measurements: 2.7 x 2.5 x 2.0 cm = volume: 7 mL. Normal
appearance/no adnexal mass.

Pulsed Doppler evaluation of both ovaries demonstrates normal
low-resistance arterial and venous waveforms.

Other findings

No abnormal free fluid.
IMPRESSION: Normal pelvic sonogram.  Normal ovarian vascularity.

## 2022-06-21 IMAGING — US US OB < 14 WEEKS - US OB TV
1 series · 15 of 28 positions shown · non-contrast
Comparison: None.

CLINICAL DATA: Vaginal bleeding.

EXAM:
OBSTETRIC <14 WK US AND TRANSVAGINAL OB US
TECHNIQUE: Both transabdominal and transvaginal ultrasound examinations were
performed for complete evaluation of the gestation as well as the
maternal uterus, adnexal regions, and pelvic cul-de-sac.
Transvaginal technique was performed to assess early pregnancy.

[Series 1: us ob < 14 weeks - us ob tv · 15 of 61 slices shown]
[im 1/61]
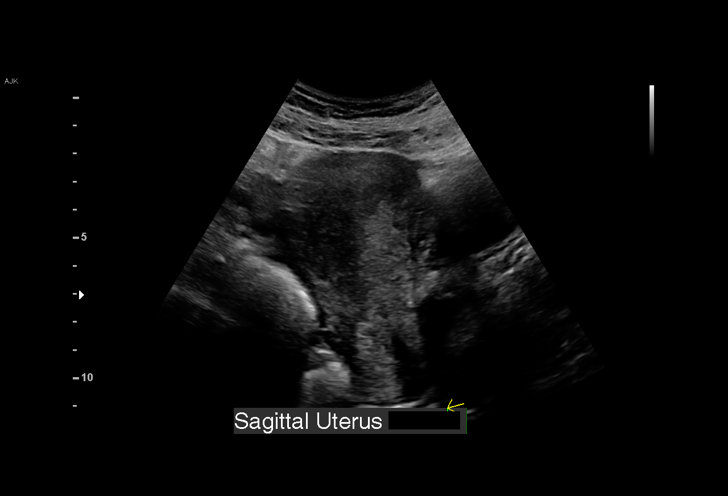
[im 5/61]
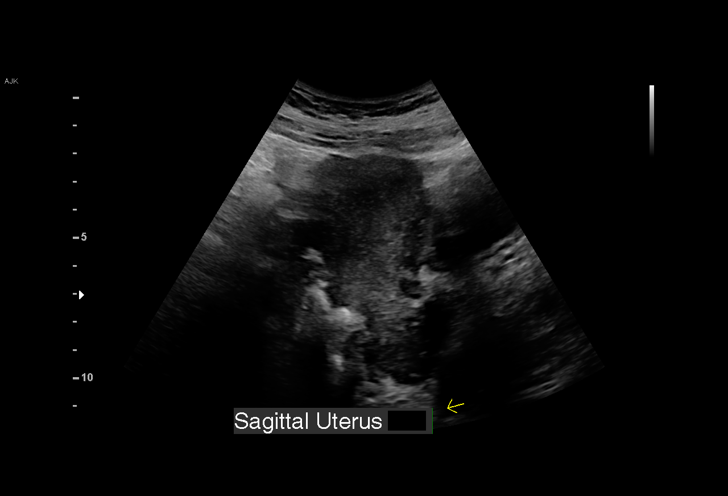
[im 9/61]
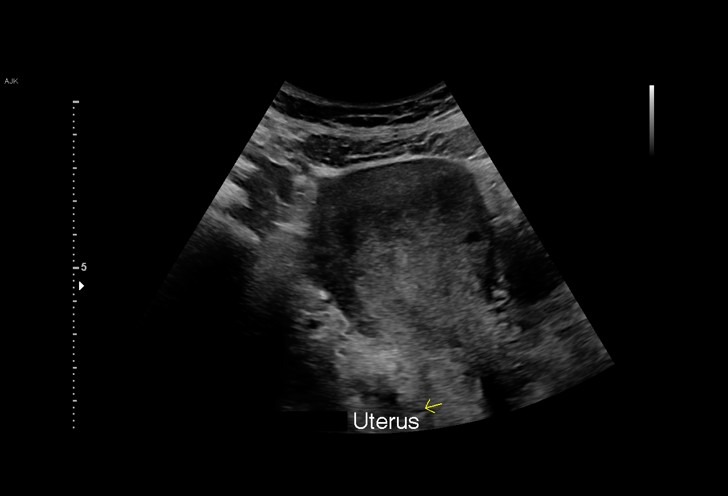
[im 14/61]
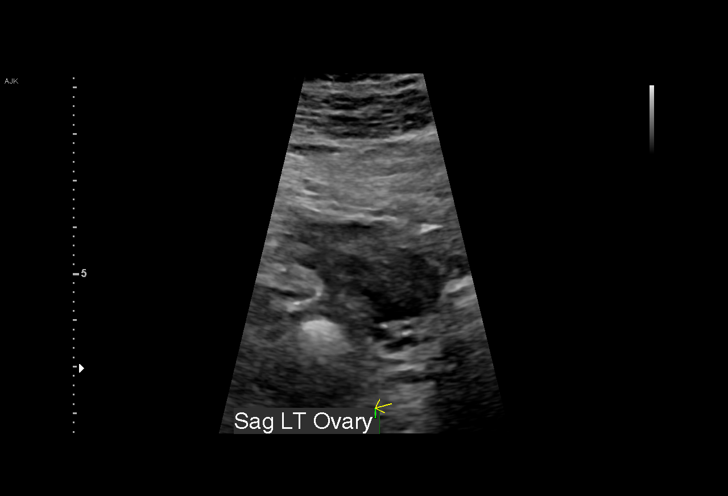
[im 18/61]
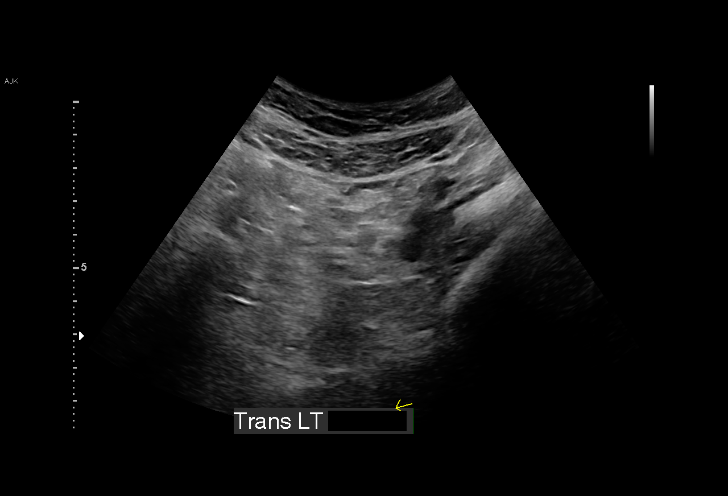
[im 23/61]
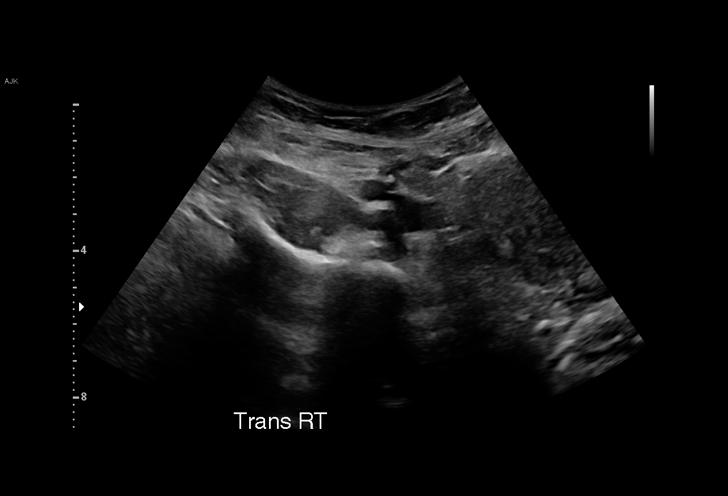
[im 27/61]
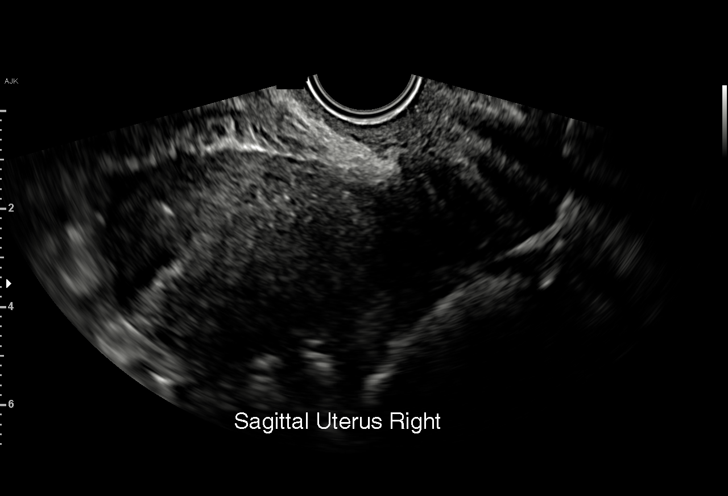
[im 32/61]
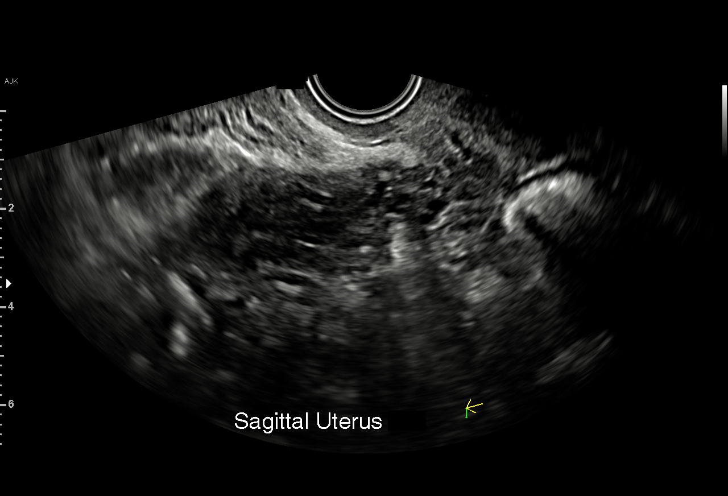
[im 34/61]
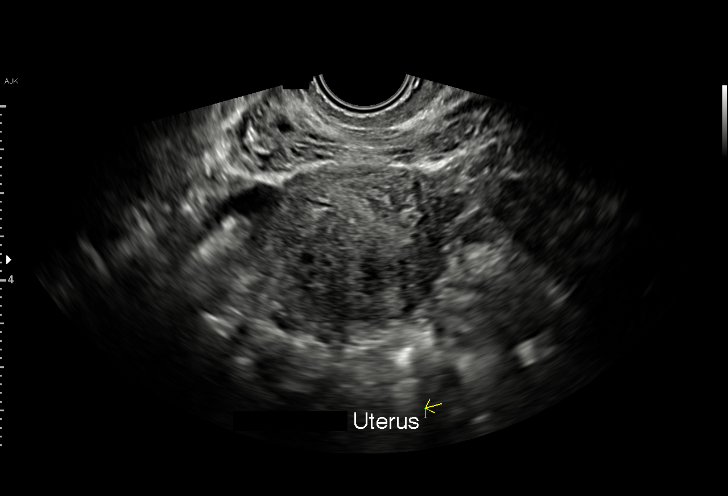
[im 38/61]
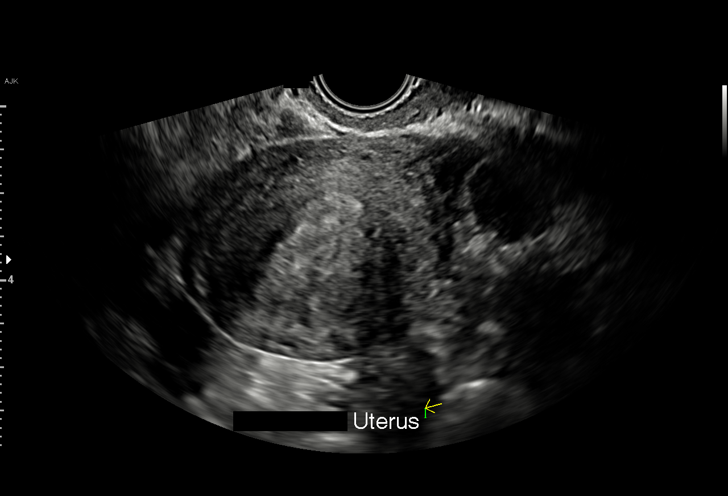
[im 43/61]
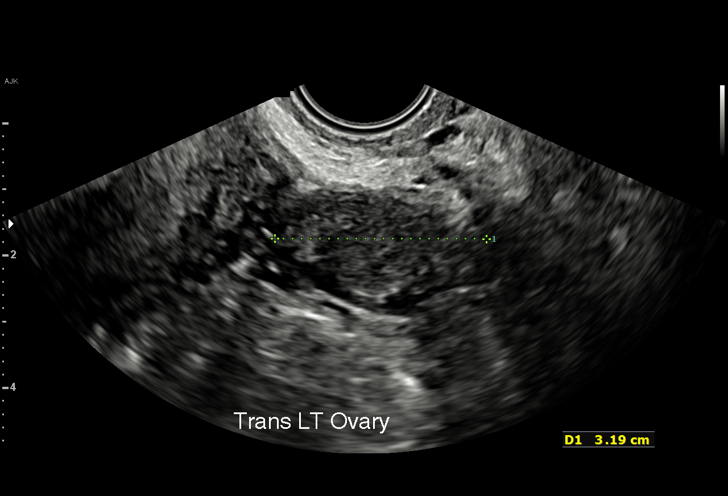
[im 47/61]
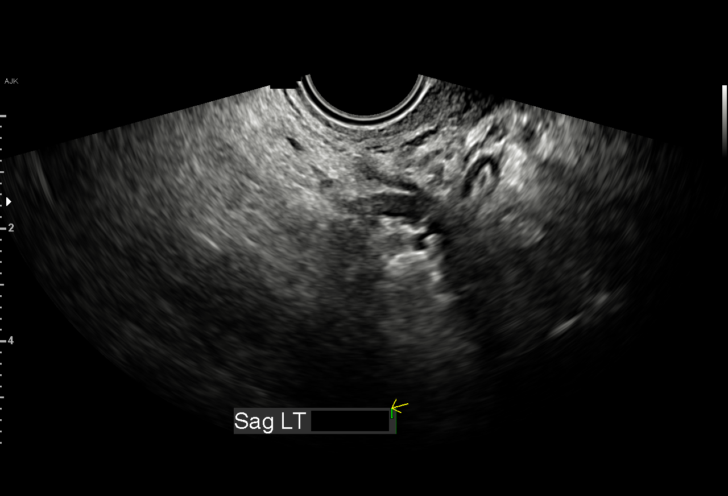
[im 52/61]
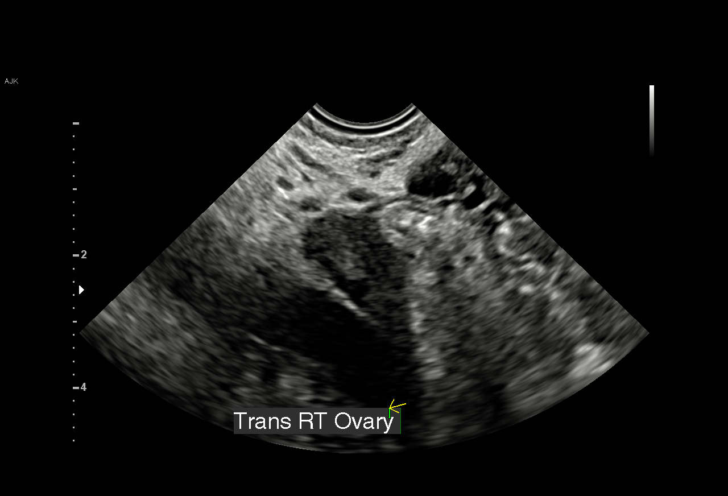
[im 56/61]
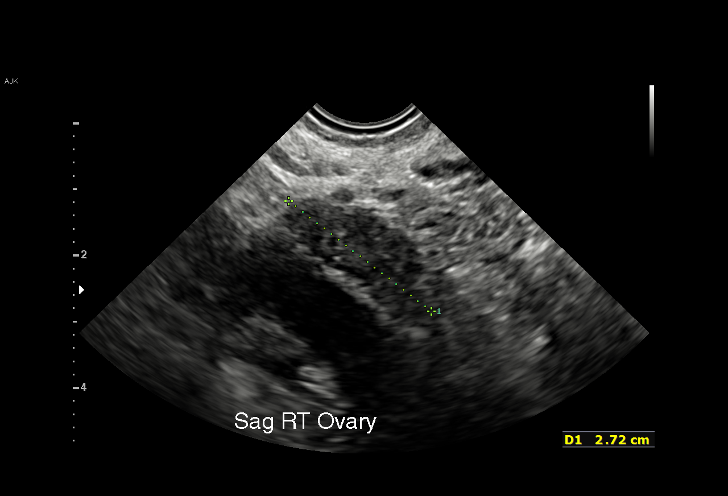
[im 61/61]
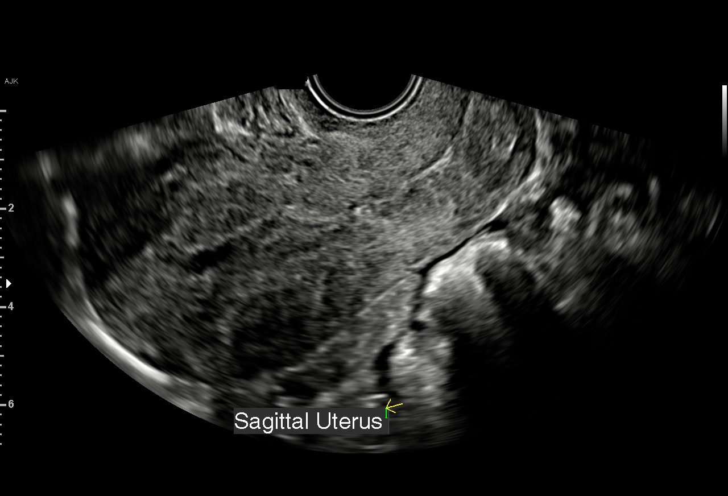

[15 of 28 positions shown; findings below may reference images not displayed]

FINDINGS: Intrauterine gestational sac: None

Yolk sac:  Not Visualized.

Embryo:  Not Visualized.

Cardiac Activity: Not Visualized.

Heart Rate: N/A  bpm

Subchorionic hemorrhage:  None visualized.

Maternal uterus/adnexae: The right ovary is visualized and is normal
in appearance.

A corpus luteum cyst is seen within the otherwise normal appearing
left ovary.

A trace amount of pelvic free fluid is seen.
IMPRESSION: No evidence of an intrauterine pregnancy. Correlation with follow-up
pelvic ultrasound and serial beta HCG levels is recommended if this
remains of clinical concern.
